# Patient Record
Sex: Female | Born: 1980 | Race: Black or African American | Hispanic: No | Marital: Single | Smoking: Former smoker
Health system: Southern US, Community
[De-identification: ages and names within clinical notes are randomized; demographics above are authoritative.]

## PROBLEM LIST (undated history)

## (undated) ENCOUNTER — Telehealth: Attending: Medical | Primary: Medical

## (undated) ENCOUNTER — Ambulatory Visit: Payer: PRIVATE HEALTH INSURANCE

## (undated) ENCOUNTER — Encounter
Attending: Rehabilitative and Restorative Service Providers" | Primary: Rehabilitative and Restorative Service Providers"

## (undated) ENCOUNTER — Ambulatory Visit

## (undated) ENCOUNTER — Telehealth
Attending: Student in an Organized Health Care Education/Training Program | Primary: Student in an Organized Health Care Education/Training Program

## (undated) ENCOUNTER — Ambulatory Visit: Payer: Medicaid (Managed Care)

## (undated) ENCOUNTER — Encounter

## (undated) ENCOUNTER — Encounter: Attending: Internal Medicine | Primary: Internal Medicine

## (undated) ENCOUNTER — Encounter: Attending: Adult Health | Primary: Adult Health

## (undated) ENCOUNTER — Ambulatory Visit: Payer: Medicaid (Managed Care) | Attending: Dermatology | Primary: Dermatology

## (undated) ENCOUNTER — Ambulatory Visit
Payer: Medicaid (Managed Care) | Attending: Student in an Organized Health Care Education/Training Program | Primary: Student in an Organized Health Care Education/Training Program

## (undated) ENCOUNTER — Ambulatory Visit: Payer: BLUE CROSS/BLUE SHIELD

## (undated) ENCOUNTER — Encounter: Attending: Neurology | Primary: Neurology

## (undated) ENCOUNTER — Telehealth

## (undated) ENCOUNTER — Ambulatory Visit
Payer: BLUE CROSS/BLUE SHIELD | Attending: Student in an Organized Health Care Education/Training Program | Primary: Student in an Organized Health Care Education/Training Program

## (undated) ENCOUNTER — Inpatient Hospital Stay: Payer: PRIVATE HEALTH INSURANCE

## (undated) ENCOUNTER — Telehealth: Attending: Family | Primary: Family

## (undated) ENCOUNTER — Encounter: Payer: MEDICAID | Attending: Registered" | Primary: Registered"

## (undated) ENCOUNTER — Encounter
Attending: Student in an Organized Health Care Education/Training Program | Primary: Student in an Organized Health Care Education/Training Program

## (undated) ENCOUNTER — Ambulatory Visit: Payer: MEDICAID

## (undated) ENCOUNTER — Ambulatory Visit: Payer: Medicaid (Managed Care) | Attending: Internal Medicine | Primary: Internal Medicine

## (undated) ENCOUNTER — Ambulatory Visit
Payer: MEDICAID | Attending: Student in an Organized Health Care Education/Training Program | Primary: Student in an Organized Health Care Education/Training Program

## (undated) ENCOUNTER — Telehealth: Attending: Nephrology | Primary: Nephrology

## (undated) ENCOUNTER — Telehealth: Attending: Ophthalmology | Primary: Ophthalmology

## (undated) ENCOUNTER — Ambulatory Visit: Payer: Medicaid (Managed Care) | Attending: Medical | Primary: Medical

## (undated) ENCOUNTER — Ambulatory Visit: Payer: PRIVATE HEALTH INSURANCE | Attending: Internal Medicine | Primary: Internal Medicine

## (undated) ENCOUNTER — Encounter: Payer: MEDICAID | Attending: Family | Primary: Family

## (undated) ENCOUNTER — Encounter: Payer: BLUE CROSS/BLUE SHIELD | Attending: Internal Medicine | Primary: Internal Medicine

## (undated) ENCOUNTER — Ambulatory Visit: Payer: BLUE CROSS/BLUE SHIELD | Attending: Internal Medicine | Primary: Internal Medicine

## (undated) ENCOUNTER — Telehealth: Attending: Physician Assistant | Primary: Physician Assistant

## (undated) ENCOUNTER — Ambulatory Visit: Payer: Medicaid (Managed Care) | Attending: Family Medicine | Primary: Family Medicine

## (undated) ENCOUNTER — Encounter: Attending: Medical | Primary: Medical

## (undated) ENCOUNTER — Non-Acute Institutional Stay: Payer: PRIVATE HEALTH INSURANCE

## (undated) ENCOUNTER — Inpatient Hospital Stay: Payer: Medicaid (Managed Care)

## (undated) ENCOUNTER — Ambulatory Visit: Payer: MEDICAID | Attending: Neurology | Primary: Neurology

## (undated) ENCOUNTER — Ambulatory Visit: Attending: Family | Primary: Family

## (undated) ENCOUNTER — Ambulatory Visit
Payer: PRIVATE HEALTH INSURANCE | Attending: Rehabilitative and Restorative Service Providers" | Primary: Rehabilitative and Restorative Service Providers"

## (undated) ENCOUNTER — Ambulatory Visit: Attending: Addiction (Substance Use Disorder) | Primary: Addiction (Substance Use Disorder)

## (undated) ENCOUNTER — Ambulatory Visit: Attending: Internal Medicine | Primary: Internal Medicine

## (undated) ENCOUNTER — Inpatient Hospital Stay

## (undated) ENCOUNTER — Encounter
Attending: Pharmacist Clinician (PhC)/ Clinical Pharmacy Specialist | Primary: Pharmacist Clinician (PhC)/ Clinical Pharmacy Specialist

## (undated) ENCOUNTER — Encounter: Payer: MEDICAID | Attending: Clinical | Primary: Clinical

## (undated) ENCOUNTER — Telehealth: Attending: Neurology | Primary: Neurology

## (undated) ENCOUNTER — Non-Acute Institutional Stay: Payer: MEDICAID

## (undated) ENCOUNTER — Ambulatory Visit: Payer: BLUE CROSS/BLUE SHIELD | Attending: Family Medicine | Primary: Family Medicine

## (undated) ENCOUNTER — Ambulatory Visit: Attending: Family Medicine | Primary: Family Medicine

## (undated) ENCOUNTER — Ambulatory Visit
Attending: Student in an Organized Health Care Education/Training Program | Primary: Student in an Organized Health Care Education/Training Program

## (undated) ENCOUNTER — Encounter: Payer: MEDICAID | Attending: Internal Medicine | Primary: Internal Medicine

## (undated) ENCOUNTER — Encounter: Attending: Adult Medicine | Primary: Adult Medicine

## (undated) ENCOUNTER — Encounter
Payer: PRIVATE HEALTH INSURANCE | Attending: Rehabilitative and Restorative Service Providers" | Primary: Rehabilitative and Restorative Service Providers"

## (undated) ENCOUNTER — Ambulatory Visit: Payer: Medicaid (Managed Care) | Attending: Neurology | Primary: Neurology

## (undated) ENCOUNTER — Encounter: Attending: Family Medicine | Primary: Family Medicine

## (undated) ENCOUNTER — Encounter: Attending: Clinical | Primary: Clinical

## (undated) ENCOUNTER — Ambulatory Visit: Payer: MEDICAID | Attending: Internal Medicine | Primary: Internal Medicine

## (undated) ENCOUNTER — Encounter: Attending: Ophthalmology | Primary: Ophthalmology

## (undated) ENCOUNTER — Encounter: Attending: Diagnostic Radiology | Primary: Diagnostic Radiology

## (undated) ENCOUNTER — Encounter: Attending: Physician Assistant | Primary: Physician Assistant

## (undated) DIAGNOSIS — H5440 Blindness, one eye, unspecified eye: Secondary | ICD-10-CM

## (undated) DIAGNOSIS — R4701 Aphasia: Secondary | ICD-10-CM

## (undated) DIAGNOSIS — L508 Other urticaria: Secondary | ICD-10-CM

## (undated) HISTORY — PX: OTHER SURGICAL HISTORY: SHX169

---
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
## 2008-04-23 ENCOUNTER — Emergency Department (HOSPITAL_COMMUNITY): Admission: EM | Admit: 2008-04-23 | Discharge: 2008-04-23 | Payer: Self-pay | Admitting: Emergency Medicine

## 2016-03-19 ENCOUNTER — Encounter (HOSPITAL_COMMUNITY): Payer: Self-pay

## 2016-03-19 ENCOUNTER — Emergency Department (HOSPITAL_COMMUNITY)
Admission: EM | Admit: 2016-03-19 | Discharge: 2016-03-19 | Disposition: A | Discharge status: Home / Self Care | Payer: BLUE CROSS/BLUE SHIELD | Attending: Emergency Medicine | Admitting: Emergency Medicine

## 2016-03-19 DIAGNOSIS — R21 Rash and other nonspecific skin eruption: Secondary | ICD-10-CM | POA: Diagnosis not present

## 2016-03-19 DIAGNOSIS — I10 Essential (primary) hypertension: Secondary | ICD-10-CM | POA: Insufficient documentation

## 2016-03-19 MED ORDER — PERMETHRIN 5 % EX CREA: 1.0000 "application " | TOPICAL_CREAM | Freq: Once | CUTANEOUS | Status: DC

## 2016-03-19 MED ORDER — HYDROCORTISONE 1 % EX CREA: TOPICAL_CREAM | CUTANEOUS | Status: DC

## 2016-03-19 NOTE — ED Notes (Addendum)
[facility] came back on Friday and started having a rash Sunday morning.  C/O itching.  Pt also requesting to have her liver enzymes checked because she says she has gained 20lb in 2 months.

## 2016-03-19 NOTE — Discharge Instructions (Signed)
Rash Use the cream as prescribed. Followup with your doctor about your blood pressure. Return to the ED if you develop new or worsening symptoms. A rash is a change in the color or texture of the skin. There are many different types of rashes. You may have other problems that accompany your rash. CAUSES   Infections.  Allergic reactions. This can include allergies to pets or foods.  Certain medicines.  Exposure to certain chemicals, soaps, or cosmetics.  Heat.  Exposure to poisonous plants.  Tumors, both cancerous and noncancerous. SYMPTOMS   Redness.  Scaly skin.  Itchy skin.  Dry or cracked skin.  Bumps.  Blisters.  Pain. DIAGNOSIS  Your caregiver may do a physical exam to determine what type of rash you have. A skin sample (biopsy) may be taken and examined under a microscope. TREATMENT  Treatment depends on the type of rash you have. Your caregiver may prescribe certain medicines. For serious conditions, you may need to see a skin doctor (dermatologist). HOME CARE INSTRUCTIONS   Avoid the substance that caused your rash.  Do not scratch your rash. This can cause infection.  You may take cool baths to help stop itching.  Only take over-the-counter or prescription medicines as directed by your caregiver.  Keep all follow-up appointments as directed by your caregiver. SEEK IMMEDIATE MEDICAL CARE IF:  You have increasing pain, swelling, or redness.  You have a fever.  You have new or severe symptoms.  You have body aches, diarrhea, or vomiting.  Your rash is not better after 3 days. MAKE SURE YOU:  Understand these instructions.  Will watch your condition.  Will get help right away if you are not doing well or get worse.   This information is not intended to replace advice given to you by your health care provider. Make sure you discuss any questions you have with your health care provider.   Document Released: 09/18/2002 Document Revised:  10/19/2014 Document Reviewed: 02/13/2015 Elsevier Interactive Patient Education [employer]2016 [employer]

## 2016-03-19 NOTE — ED Provider Notes (Signed)
CSN: [id number]     Arrival date & time 03/19/16  [id number] History   First MD Initiated Contact with Patient 03/19/16 [phone]     Chief Complaint  Patient presents with  . Rash     (Consider location/radiation/quality/duration/timing/severity/associated sxs/prior Treatment) HPI Comments: Patient presents with itchy red rash that started 5 days ago while she was staying at a hotel.  Boyfriend with similar rash.  Denies difficulty breathing or swallowing. No chest pain or SOB.  No fever.  Denies new soaps, lotions, detergents, medications, foods.  Has been using benadryl with partial relief.  Did not see any bugs.  Patient is a 35 y.o. female presenting with rash. The history is provided by the patient.  Rash Associated symptoms: no abdominal pain, no fever, no headaches, no joint pain, no myalgias, no nausea, no shortness of breath and not vomiting     History reviewed. No pertinent past medical history. Past Surgical History  Procedure Laterality Date  . Tubes in ears     No family history on file. Social History  Substance Use Topics  . Smoking status: Never Smoker   . Smokeless tobacco: None  . Alcohol Use: Yes     Comment: weekends   OB History    No data available     Review of Systems  Constitutional: Negative for fever, activity change and appetite change.  Respiratory: Negative for cough, chest tightness and shortness of breath.   Cardiovascular: Negative for chest pain.  Gastrointestinal: Negative for nausea, vomiting and abdominal pain.  Genitourinary: Negative for dysuria, hematuria, vaginal bleeding and vaginal discharge.  Musculoskeletal: Negative for myalgias and arthralgias.  Skin: Positive for rash.  Neurological: Negative for dizziness, light-headedness and headaches.  A complete 10 system review of systems was obtained and all systems are negative except as noted in the HPI and PMH.      Allergies  Sulfa antibiotics  Home Medications   Prior to Admission  medications   Medication Sig Start Date End Date Taking? Authorizing Provider  hydrocortisone cream 1 % Apply to affected area 2 times daily 03/19/16   Glynn OctaveStephen Dawon Troop, MD  permethrin (ELIMITE) 5 % cream Apply 1 application topically once. 03/19/16   Glynn OctaveStephen Almina Schul, MD   BP 147/105 mmHg  Pulse 110  Temp(Src) 97.6 F (36.4 C) (Oral)  Resp 18  Ht 5\' 6"  (1.676 m)  Wt 213 lb (96.616 kg)  BMI 34.40 kg/m2  SpO2 100%  LMP 03/12/2016 Physical Exam  Constitutional: She is oriented to person, place, and time. She appears well-developed and well-nourished. No distress.  HENT:  Head: Normocephalic and atraumatic.  Mouth/Throat: Oropharynx is clear and moist. No oropharyngeal exudate.  No tongue or lip swelling  Eyes: Conjunctivae and EOM are normal. Pupils are equal, round, and reactive to light.  Neck: Normal range of motion. Neck supple.  No meningismus.  Cardiovascular: Normal rate, regular rhythm, normal heart sounds and intact distal pulses.   No murmur heard. Pulmonary/Chest: Effort normal and breath sounds normal. No respiratory distress.  Abdominal: Soft. There is no tenderness. There is no rebound and no guarding.  Musculoskeletal: Normal range of motion. She exhibits no edema or tenderness.  Neurological: She is alert and oriented to person, place, and time. No cranial nerve deficit. She exhibits normal muscle tone. Coordination normal.  No ataxia on finger to nose bilaterally. No pronator drift. 5/5 strength throughout. CN 2-12 intact.Equal grip strength. Sensation intact.   Skin: Skin is warm. Rash noted.  Scattered erythematous papules  to trunk, legs, arms.  No burrows to hands or feet.  Psychiatric: She has a normal mood and affect. Her behavior is normal.  Nursing note and vitals reviewed.   ED Course  Procedures (including critical care time) Labs Review Labs Reviewed - No data to display  Imaging Review No results found. I have personally reviewed and evaluated these  images and lab results as part of my medical decision-making.   EKG Interpretation None      MDM   Final diagnoses:  Rash and nonspecific skin eruption  Essential hypertension   Itchy rash after staying in hotel.  Possibly bed bugs. Less likely scabies.  No systemic symptoms.  BP elevated.  D/w Patient need for PCP follow up.  No headache, chest pain, SOB, vision change.  We'll treat with permethrin for possible scabies versus bed bugs. Follow-up with PCP regarding elevated blood pressure. Return precautions discussed.  Glynn Octave, MD 03/19/16 [phone]

## 2017-09-07 ENCOUNTER — Emergency Department
Admission: EM | Admit: 2017-09-07 | Discharge: 2017-09-07 | Disposition: A | Discharge status: Home / Self Care | Payer: MEDICARE | Source: Intra-hospital

## 2018-04-12 ENCOUNTER — Encounter: Admit: 2018-04-12 | Discharge: 2018-04-12 | Disposition: A | Discharge status: Home / Self Care | Payer: MEDICAID

## 2018-04-12 DIAGNOSIS — R509 Fever, unspecified: Principal | ICD-10-CM

## 2018-10-31 ENCOUNTER — Encounter: Admit: 2018-10-31 | Discharge: 2018-11-01 | Payer: MEDICAID

## 2018-11-14 ENCOUNTER — Encounter: Admit: 2018-11-14 | Discharge: 2018-11-15 | Payer: MEDICAID

## 2018-11-23 ENCOUNTER — Encounter: Admit: 2018-11-23 | Discharge: 2018-11-24 | Payer: MEDICAID

## 2018-11-30 ENCOUNTER — Encounter: Admit: 2018-11-30 | Discharge: 2018-12-01 | Payer: MEDICAID

## 2018-12-05 ENCOUNTER — Encounter: Admit: 2018-12-05 | Discharge: 2018-12-06 | Discharge status: Against Medical Advice | Payer: MEDICAID

## 2019-03-20 ENCOUNTER — Encounter: Admit: 2019-03-20 | Discharge: 2019-03-21 | Payer: MEDICAID

## 2019-03-20 DIAGNOSIS — R0602 Shortness of breath: Secondary | ICD-10-CM

## 2019-03-20 DIAGNOSIS — L508 Other urticaria: Principal | ICD-10-CM

## 2019-03-20 MED ORDER — MONTELUKAST 10 MG TABLET
ORAL_TABLET | Freq: Every day | ORAL | 2 refills | 0.00000 days | Status: CP
Start: 2019-03-20 — End: 2020-03-19

## 2019-04-25 ENCOUNTER — Encounter: Admit: 2019-04-25 | Discharge: 2019-04-26 | Payer: MEDICAID | Attending: Adult Health | Primary: Adult Health

## 2019-04-25 DIAGNOSIS — R0602 Shortness of breath: Principal | ICD-10-CM

## 2019-04-25 DIAGNOSIS — R0600 Dyspnea, unspecified: Secondary | ICD-10-CM

## 2019-05-02 ENCOUNTER — Encounter: Admit: 2019-05-02 | Discharge: 2019-05-03 | Payer: MEDICAID | Attending: Adult Health | Primary: Adult Health

## 2019-05-02 ENCOUNTER — Encounter: Admit: 2019-05-02 | Discharge: 2019-05-03 | Payer: MEDICAID

## 2019-05-02 DIAGNOSIS — R0602 Shortness of breath: Principal | ICD-10-CM

## 2019-05-02 DIAGNOSIS — R0600 Dyspnea, unspecified: Secondary | ICD-10-CM

## 2019-05-02 DIAGNOSIS — Z720 Tobacco use: Secondary | ICD-10-CM

## 2019-05-02 MED ORDER — FUROSEMIDE 40 MG TABLET: 40 mg | tablet | Freq: Every day | 6 refills | 30 days | Status: AC

## 2019-05-02 MED ORDER — FUROSEMIDE 40 MG TABLET
ORAL_TABLET | Freq: Every day | ORAL | 6 refills | 30.00000 days | Status: CP
Start: 2019-05-02 — End: 2019-05-02

## 2019-05-29 ENCOUNTER — Encounter: Admit: 2019-05-29 | Discharge: 2019-05-30 | Payer: MEDICAID

## 2019-05-29 DIAGNOSIS — J3089 Other allergic rhinitis: Secondary | ICD-10-CM

## 2019-05-29 DIAGNOSIS — L508 Other urticaria: Principal | ICD-10-CM

## 2019-05-29 MED ORDER — EPINEPHRINE 0.3 MG/0.3 ML INJECTION, AUTO-INJECTOR: 0 mg | each | Freq: Once | 3 refills | 1 days | Status: AC

## 2019-05-29 MED ORDER — EPINEPHRINE 0.3 MG/0.3 ML INJECTION, AUTO-INJECTOR
Freq: Once | INTRAMUSCULAR | 3 refills | 1.00000 days | Status: CP
Start: 2019-05-29 — End: 2019-05-29

## 2019-07-12 DIAGNOSIS — L508 Other urticaria: Secondary | ICD-10-CM

## 2019-07-12 DIAGNOSIS — L501 Idiopathic urticaria: Secondary | ICD-10-CM

## 2020-01-16 DIAGNOSIS — L508 Other urticaria: Principal | ICD-10-CM

## 2020-01-17 MED ORDER — FUROSEMIDE 40 MG TABLET
ORAL_TABLET | Freq: Every day | ORAL | 0 refills | 30 days | Status: CP | PRN
Start: 2020-01-17 — End: ?

## 2020-01-17 MED ORDER — EPINEPHRINE 0.3 MG/0.3 ML INJECTION, AUTO-INJECTOR
Freq: Once | INTRAMUSCULAR | 3 refills | 1.00000 days | Status: CP
Start: 2020-01-17 — End: 2020-01-17

## 2020-01-17 MED ORDER — MONTELUKAST 10 MG TABLET
ORAL_TABLET | Freq: Every day | ORAL | 2 refills | 30.00000 days | Status: CP
Start: 2020-01-17 — End: 2021-01-16

## 2020-01-18 ENCOUNTER — Encounter: Admit: 2020-01-18 | Discharge: 2020-01-19 | Payer: MEDICAID

## 2020-01-18 DIAGNOSIS — J029 Acute pharyngitis, unspecified: Principal | ICD-10-CM

## 2020-01-18 DIAGNOSIS — R05 Cough: Principal | ICD-10-CM

## 2020-01-18 DIAGNOSIS — N421 Congestion and hemorrhage of prostate: Principal | ICD-10-CM

## 2020-01-19 ENCOUNTER — Encounter: Admit: 2020-01-19 | Discharge: 2020-01-20 | Payer: MEDICAID

## 2020-01-19 DIAGNOSIS — L508 Other urticaria: Principal | ICD-10-CM

## 2020-01-19 MED ORDER — EPINEPHRINE 0.3 MG/0.3 ML INJECTION, AUTO-INJECTOR
Freq: Once | INTRAMUSCULAR | 3 refills | 2.00000 days | Status: CP
Start: 2020-01-19 — End: 2020-01-19

## 2020-01-19 MED ORDER — BENZONATATE 100 MG CAPSULE
ORAL_CAPSULE | Freq: Three times a day (TID) | ORAL | 1 refills | 5 days | Status: CP | PRN
Start: 2020-01-19 — End: 2021-01-18

## 2020-01-19 MED ORDER — ALBUTEROL SULFATE HFA 90 MCG/ACTUATION AEROSOL INHALER
RESPIRATORY_TRACT | 0 refills | 0 days | Status: CP | PRN
Start: 2020-01-19 — End: 2021-01-18

## 2020-01-22 DIAGNOSIS — J3089 Other allergic rhinitis: Principal | ICD-10-CM

## 2020-02-06 ENCOUNTER — Ambulatory Visit: Admit: 2020-02-06 | Discharge: 2020-02-07 | Payer: MEDICAID

## 2020-02-06 DIAGNOSIS — J3089 Other allergic rhinitis: Principal | ICD-10-CM

## 2020-02-06 DIAGNOSIS — I10 Essential (primary) hypertension: Principal | ICD-10-CM

## 2020-02-06 DIAGNOSIS — G4730 Sleep apnea, unspecified: Principal | ICD-10-CM

## 2020-02-06 DIAGNOSIS — Z113 Encounter for screening for infections with a predominantly sexual mode of transmission: Principal | ICD-10-CM

## 2020-02-06 MED ORDER — CHLORTHALIDONE 25 MG TABLET
ORAL_TABLET | Freq: Every morning | ORAL | 11 refills | 30 days | Status: CP
Start: 2020-02-06 — End: 2021-02-05

## 2020-02-07 DIAGNOSIS — B9689 Other specified bacterial agents as the cause of diseases classified elsewhere: Principal | ICD-10-CM

## 2020-02-07 DIAGNOSIS — N760 Acute vaginitis: Principal | ICD-10-CM

## 2020-02-07 MED ORDER — METRONIDAZOLE 500 MG TABLET
ORAL_TABLET | Freq: Two times a day (BID) | ORAL | 0 refills | 7.00000 days | Status: CP
Start: 2020-02-07 — End: 2020-02-14

## 2020-02-09 DIAGNOSIS — L501 Idiopathic urticaria: Principal | ICD-10-CM

## 2020-02-12 ENCOUNTER — Institutional Professional Consult (permissible substitution): Admit: 2020-02-12 | Discharge: 2020-02-13

## 2020-02-12 DIAGNOSIS — L501 Idiopathic urticaria: Principal | ICD-10-CM

## 2020-03-08 ENCOUNTER — Ambulatory Visit: Admit: 2020-03-08 | Discharge: 2020-03-09

## 2020-03-12 ENCOUNTER — Ambulatory Visit: Admit: 2020-03-12 | Payer: MEDICAID

## 2020-03-12 ENCOUNTER — Encounter: Admit: 2020-03-12 | Payer: MEDICAID

## 2020-03-19 ENCOUNTER — Encounter: Admit: 2020-03-19 | Discharge: 2020-03-20 | Payer: MEDICAID

## 2020-03-22 ENCOUNTER — Ambulatory Visit: Admit: 2020-03-22 | Payer: MEDICAID

## 2020-03-25 ENCOUNTER — Telehealth
Admit: 2020-03-25 | Discharge: 2020-03-26 | Payer: MEDICAID | Attending: Student in an Organized Health Care Education/Training Program | Primary: Student in an Organized Health Care Education/Training Program

## 2020-04-10 DIAGNOSIS — L501 Idiopathic urticaria: Principal | ICD-10-CM

## 2020-04-10 MED ORDER — CETIRIZINE 10 MG TABLET
ORAL_TABLET | Freq: Two times a day (BID) | ORAL | 11 refills | 30.00000 days | Status: CP
Start: 2020-04-10 — End: 2021-04-10

## 2020-04-16 ENCOUNTER — Ambulatory Visit: Admit: 2020-04-16 | Discharge: 2020-04-17

## 2020-04-16 DIAGNOSIS — L501 Idiopathic urticaria: Principal | ICD-10-CM

## 2020-05-07 ENCOUNTER — Encounter: Admit: 2020-05-07 | Discharge: 2020-05-07 | Payer: MEDICAID

## 2020-05-07 ENCOUNTER — Ambulatory Visit
Admit: 2020-05-07 | Discharge: 2020-05-07 | Payer: MEDICAID | Attending: Physician Assistant | Primary: Physician Assistant

## 2020-05-07 DIAGNOSIS — Z6841 Body Mass Index (BMI) 40.0 and over, adult: Principal | ICD-10-CM

## 2020-05-07 DIAGNOSIS — L501 Idiopathic urticaria: Principal | ICD-10-CM

## 2020-06-11 ENCOUNTER — Ambulatory Visit: Admit: 2020-06-11 | Payer: MEDICAID

## 2020-06-11 ENCOUNTER — Ambulatory Visit: Admit: 2020-06-11 | Payer: MEDICAID | Attending: Physician Assistant | Primary: Physician Assistant

## 2020-06-18 ENCOUNTER — Ambulatory Visit
Admit: 2020-06-18 | Discharge: 2020-06-19 | Payer: MEDICAID | Attending: Student in an Organized Health Care Education/Training Program | Primary: Student in an Organized Health Care Education/Training Program

## 2020-06-18 DIAGNOSIS — R0989 Other specified symptoms and signs involving the circulatory and respiratory systems: Principal | ICD-10-CM

## 2020-06-18 DIAGNOSIS — H6980 Other specified disorders of Eustachian tube, unspecified ear: Principal | ICD-10-CM

## 2020-06-18 DIAGNOSIS — J309 Allergic rhinitis, unspecified: Principal | ICD-10-CM

## 2020-06-18 MED ORDER — OMEPRAZOLE 20 MG CAPSULE,DELAYED RELEASE
ORAL_CAPSULE | Freq: Every day | ORAL | 3 refills | 90 days | Status: CP
Start: 2020-06-18 — End: 2021-06-18

## 2020-06-18 MED ORDER — FLUTICASONE PROPIONATE 50 MCG/ACTUATION NASAL SPRAY,SUSPENSION
Freq: Two times a day (BID) | NASAL | 2 refills | 0 days | Status: CP
Start: 2020-06-18 — End: 2021-06-18

## 2020-08-06 ENCOUNTER — Encounter: Admit: 2020-08-06 | Discharge: 2020-08-07 | Payer: MEDICAID

## 2020-08-06 DIAGNOSIS — L501 Idiopathic urticaria: Principal | ICD-10-CM

## 2020-08-20 ENCOUNTER — Ambulatory Visit: Admit: 2020-08-20 | Discharge: 2020-08-21 | Payer: MEDICAID | Attending: Internal Medicine | Primary: Internal Medicine

## 2020-08-20 DIAGNOSIS — I10 Essential (primary) hypertension: Principal | ICD-10-CM

## 2020-08-20 DIAGNOSIS — E669 Obesity, unspecified: Principal | ICD-10-CM

## 2020-08-20 DIAGNOSIS — R0602 Shortness of breath: Principal | ICD-10-CM

## 2020-08-20 DIAGNOSIS — R609 Edema, unspecified: Principal | ICD-10-CM

## 2020-08-20 MED ORDER — FUROSEMIDE 40 MG TABLET
ORAL_TABLET | 3 refills | 0 days | Status: CP
Start: 2020-08-20 — End: ?

## 2020-08-21 DIAGNOSIS — I10 Essential (primary) hypertension: Principal | ICD-10-CM

## 2020-08-21 MED ORDER — POTASSIUM CHLORIDE ER 10 MEQ TABLET,EXTENDED RELEASE
ORAL_TABLET | Freq: Every day | ORAL | 11 refills | 30 days | Status: CP
Start: 2020-08-21 — End: 2021-08-21

## 2020-09-09 DIAGNOSIS — G4730 Sleep apnea, unspecified: Principal | ICD-10-CM

## 2020-12-20 DIAGNOSIS — I639 Cerebral infarction, unspecified: Secondary | ICD-10-CM

## 2020-12-20 HISTORY — DX: Cerebral infarction, unspecified: I63.9

## 2020-12-21 ENCOUNTER — Ambulatory Visit
Admit: 2020-12-21 | Discharge: 2020-12-23 | Disposition: A | Discharge status: Home / Self Care | Payer: MEDICAID | Admitting: Neurology

## 2020-12-23 MED ORDER — ATORVASTATIN 80 MG TABLET
ORAL_TABLET | Freq: Every evening | ORAL | 3 refills | 30.00000 days | Status: CP
Start: 2020-12-23 — End: 2021-01-22

## 2020-12-24 MED ORDER — ASPIRIN 81 MG CHEWABLE TABLET
ORAL_TABLET | Freq: Every day | ORAL | 4 refills | 30.00000 days | Status: CP
Start: 2020-12-24 — End: 2021-01-23

## 2020-12-31 ENCOUNTER — Encounter: Admit: 2020-12-31 | Discharge: 2020-12-31 | Disposition: A | Discharge status: Home / Self Care | Payer: MEDICAID

## 2020-12-31 ENCOUNTER — Emergency Department: Admit: 2020-12-31 | Discharge: 2020-12-31 | Disposition: A | Discharge status: Home / Self Care | Payer: MEDICAID

## 2020-12-31 DIAGNOSIS — R519 Periorbital headache: Principal | ICD-10-CM

## 2020-12-31 MED ORDER — PREDNISONE 20 MG TABLET
ORAL_TABLET | Freq: Every day | ORAL | 0 refills | 3 days | Status: CP
Start: 2020-12-31 — End: 2021-01-03

## 2021-01-09 ENCOUNTER — Ambulatory Visit: Admit: 2021-01-09 | Discharge: 2021-01-10 | Payer: MEDICAID

## 2021-01-09 DIAGNOSIS — H3412 Central retinal artery occlusion, left eye: Principal | ICD-10-CM

## 2021-01-09 DIAGNOSIS — I6522 Occlusion and stenosis of left carotid artery: Principal | ICD-10-CM

## 2021-01-09 DIAGNOSIS — I639 Cerebral infarction, unspecified: Principal | ICD-10-CM

## 2021-01-16 ENCOUNTER — Encounter: Admit: 2021-01-16 | Discharge: 2021-01-17 | Payer: MEDICAID

## 2021-01-16 DIAGNOSIS — R748 Abnormal levels of other serum enzymes: Principal | ICD-10-CM

## 2021-01-16 DIAGNOSIS — I639 Cerebral infarction, unspecified: Principal | ICD-10-CM

## 2021-01-21 ENCOUNTER — Ambulatory Visit: Admit: 2021-01-21 | Discharge: 2021-01-22 | Payer: MEDICAID

## 2021-01-21 DIAGNOSIS — Z30430 Encounter for insertion of intrauterine contraceptive device: Principal | ICD-10-CM

## 2021-01-21 DIAGNOSIS — N852 Hypertrophy of uterus: Principal | ICD-10-CM

## 2021-01-31 DIAGNOSIS — Z1239 Encounter for other screening for malignant neoplasm of breast: Principal | ICD-10-CM

## 2021-02-03 ENCOUNTER — Encounter: Admit: 2021-02-03 | Discharge: 2021-02-04 | Payer: MEDICAID

## 2021-02-14 ENCOUNTER — Encounter: Admit: 2021-02-14 | Discharge: 2021-02-15 | Payer: MEDICAID

## 2021-02-14 DIAGNOSIS — L501 Idiopathic urticaria: Principal | ICD-10-CM

## 2021-03-11 ENCOUNTER — Ambulatory Visit
Admit: 2021-03-11 | Payer: BLUE CROSS/BLUE SHIELD | Attending: Student in an Organized Health Care Education/Training Program | Primary: Student in an Organized Health Care Education/Training Program

## 2021-03-13 ENCOUNTER — Encounter: Admit: 2021-03-13 | Discharge: 2021-04-11 | Payer: BLUE CROSS/BLUE SHIELD

## 2021-03-13 ENCOUNTER — Encounter
Admit: 2021-03-13 | Discharge: 2021-03-14 | Payer: BLUE CROSS/BLUE SHIELD | Attending: Student in an Organized Health Care Education/Training Program | Primary: Student in an Organized Health Care Education/Training Program

## 2021-03-13 ENCOUNTER — Encounter: Admit: 2021-03-13 | Discharge: 2021-03-14 | Payer: BLUE CROSS/BLUE SHIELD

## 2021-03-13 ENCOUNTER — Encounter: Admit: 2021-03-13 | Discharge: 2021-04-11 | Payer: PRIVATE HEALTH INSURANCE

## 2021-03-13 ENCOUNTER — Ambulatory Visit: Admit: 2021-03-13 | Discharge: 2021-04-11 | Payer: BLUE CROSS/BLUE SHIELD

## 2021-03-13 DIAGNOSIS — L501 Idiopathic urticaria: Principal | ICD-10-CM

## 2021-03-17 ENCOUNTER — Ambulatory Visit
Admit: 2021-03-17 | Discharge: 2021-03-17 | Disposition: A | Discharge status: Home / Self Care | Payer: BLUE CROSS/BLUE SHIELD

## 2021-03-17 ENCOUNTER — Encounter
Admit: 2021-03-17 | Discharge: 2021-03-17 | Disposition: A | Discharge status: Home / Self Care | Payer: BLUE CROSS/BLUE SHIELD

## 2021-03-17 DIAGNOSIS — I6522 Occlusion and stenosis of left carotid artery: Principal | ICD-10-CM

## 2021-03-17 DIAGNOSIS — J029 Acute pharyngitis, unspecified: Principal | ICD-10-CM

## 2021-04-09 ENCOUNTER — Encounter: Admit: 2021-04-09 | Discharge: 2021-04-10 | Payer: BLUE CROSS/BLUE SHIELD

## 2021-04-09 DIAGNOSIS — N898 Other specified noninflammatory disorders of vagina: Principal | ICD-10-CM

## 2021-04-09 DIAGNOSIS — I639 Cerebral infarction, unspecified: Principal | ICD-10-CM

## 2021-04-09 DIAGNOSIS — R6884 Jaw pain: Principal | ICD-10-CM

## 2021-04-09 MED ORDER — ATORVASTATIN 20 MG TABLET
ORAL_TABLET | Freq: Every evening | ORAL | 3 refills | 90 days | Status: CP
Start: 2021-04-09 — End: 2021-07-08

## 2021-04-10 DIAGNOSIS — A599 Trichomoniasis, unspecified: Principal | ICD-10-CM

## 2021-04-10 MED ORDER — METRONIDAZOLE 500 MG TABLET
ORAL_TABLET | Freq: Once | ORAL | 0 refills | 1.00000 days | Status: CP
Start: 2021-04-10 — End: 2021-04-10

## 2021-04-11 ENCOUNTER — Institutional Professional Consult (permissible substitution): Admit: 2021-04-11 | Discharge: 2021-04-11

## 2021-04-11 ENCOUNTER — Ambulatory Visit: Admit: 2021-04-11 | Discharge: 2021-04-11

## 2021-04-11 DIAGNOSIS — Z9189 Other specified personal risk factors, not elsewhere classified: Principal | ICD-10-CM

## 2021-04-11 DIAGNOSIS — R6884 Jaw pain: Principal | ICD-10-CM

## 2021-04-11 DIAGNOSIS — J3081 Allergic rhinitis due to animal (cat) (dog) hair and dander: Principal | ICD-10-CM

## 2021-04-11 DIAGNOSIS — J3089 Other allergic rhinitis: Principal | ICD-10-CM

## 2021-04-11 DIAGNOSIS — J301 Allergic rhinitis due to pollen: Principal | ICD-10-CM

## 2021-04-11 DIAGNOSIS — L501 Idiopathic urticaria: Principal | ICD-10-CM

## 2021-04-16 ENCOUNTER — Encounter: Admit: 2021-04-16 | Discharge: 2021-04-17 | Payer: BLUE CROSS/BLUE SHIELD

## 2021-04-16 DIAGNOSIS — F0631 Mood disorder due to known physiological condition with depressive features: Principal | ICD-10-CM

## 2021-04-16 DIAGNOSIS — D509 Iron deficiency anemia, unspecified: Principal | ICD-10-CM

## 2021-04-16 DIAGNOSIS — I639 Cerebral infarction, unspecified: Principal | ICD-10-CM

## 2021-04-16 MED ORDER — AMITRIPTYLINE 25 MG TABLET
ORAL_TABLET | Freq: Every evening | ORAL | 3 refills | 180.00000 days | Status: CP
Start: 2021-04-16 — End: 2022-04-16

## 2021-04-20 DIAGNOSIS — R6884 Jaw pain: Principal | ICD-10-CM

## 2021-04-20 DIAGNOSIS — A599 Trichomoniasis, unspecified: Principal | ICD-10-CM

## 2021-04-20 MED ORDER — METRONIDAZOLE 500 MG TABLET
ORAL_TABLET | Freq: Once | ORAL | 0 refills | 1 days | Status: CP
Start: 2021-04-20 — End: 2021-04-20

## 2021-04-24 ENCOUNTER — Ambulatory Visit: Admit: 2021-04-24 | Discharge: 2021-04-25

## 2021-04-24 ENCOUNTER — Ambulatory Visit: Admit: 2021-04-24 | Discharge: 2021-04-25 | Attending: Internal Medicine | Primary: Internal Medicine

## 2021-04-24 DIAGNOSIS — Z8673 Personal history of transient ischemic attack (TIA), and cerebral infarction without residual deficits: Principal | ICD-10-CM

## 2021-04-24 DIAGNOSIS — L501 Idiopathic urticaria: Principal | ICD-10-CM

## 2021-04-24 DIAGNOSIS — I10 Essential (primary) hypertension: Principal | ICD-10-CM

## 2021-04-24 DIAGNOSIS — I6522 Occlusion and stenosis of left carotid artery: Principal | ICD-10-CM

## 2021-04-24 MED ORDER — XOLAIR 150 MG/ML SUBCUTANEOUS SYRINGE
SUBCUTANEOUS | 11 refills | 28 days | Status: CP
Start: 2021-04-24 — End: ?

## 2021-04-24 MED ORDER — ROSUVASTATIN 20 MG TABLET
ORAL_TABLET | Freq: Every day | ORAL | 6 refills | 30 days | Status: CP
Start: 2021-04-24 — End: 2021-05-24

## 2021-06-02 ENCOUNTER — Emergency Department (HOSPITAL_COMMUNITY): Payer: Self-pay

## 2021-06-02 ENCOUNTER — Encounter (HOSPITAL_COMMUNITY): Payer: Self-pay | Admitting: *Deleted

## 2021-06-02 ENCOUNTER — Other Ambulatory Visit: Payer: Self-pay

## 2021-06-02 ENCOUNTER — Emergency Department (HOSPITAL_COMMUNITY)
Admission: EM | Admit: 2021-06-02 | Discharge: 2021-06-02 | Disposition: A | Discharge status: Home / Self Care | Payer: Self-pay | Attending: Student | Admitting: Student

## 2021-06-02 DIAGNOSIS — L501 Idiopathic urticaria: Principal | ICD-10-CM

## 2021-06-02 DIAGNOSIS — Z20822 Contact with and (suspected) exposure to covid-19: Secondary | ICD-10-CM | POA: Insufficient documentation

## 2021-06-02 DIAGNOSIS — M5412 Radiculopathy, cervical region: Secondary | ICD-10-CM | POA: Insufficient documentation

## 2021-06-02 DIAGNOSIS — M5033 Other cervical disc degeneration, cervicothoracic region: Secondary | ICD-10-CM | POA: Insufficient documentation

## 2021-06-02 DIAGNOSIS — R1013 Epigastric pain: Secondary | ICD-10-CM | POA: Insufficient documentation

## 2021-06-02 DIAGNOSIS — Z87891 Personal history of nicotine dependence: Secondary | ICD-10-CM | POA: Insufficient documentation

## 2021-06-02 DIAGNOSIS — M5030 Other cervical disc degeneration, unspecified cervical region: Secondary | ICD-10-CM

## 2021-06-02 DIAGNOSIS — Y900 Blood alcohol level of less than 20 mg/100 ml: Secondary | ICD-10-CM | POA: Insufficient documentation

## 2021-06-02 HISTORY — DX: Aphasia: R47.01

## 2021-06-02 HISTORY — DX: Blindness, one eye, unspecified eye: H54.40

## 2021-06-02 HISTORY — DX: Other urticaria: L50.8

## 2021-06-02 LAB — HEPATIC FUNCTION PANEL
ALT: 15 U/L (ref 0–44)
AST: 18 U/L (ref 15–41)
Albumin: 3.3 g/dL — ABNORMAL LOW (ref 3.5–5.0)
Alkaline Phosphatase: 87 U/L (ref 38–126)
Bilirubin, Direct: <0.1 mg/dL (ref 0.0–0.2)
Total Bilirubin: 0.5 mg/dL (ref 0.3–1.2)
Total Protein: 7.6 g/dL (ref 6.5–8.1)

## 2021-06-02 LAB — BASIC METABOLIC PANEL
Anion gap: 6 (ref 5–15)
BUN: 5 mg/dL — ABNORMAL LOW (ref 6–20)
CO2: 26 mmol/L (ref 22–32)
Calcium: 8.9 mg/dL (ref 8.9–10.3)
Chloride: 104 mmol/L (ref 98–111)
Creatinine, Ser: 0.85 mg/dL (ref 0.44–1.00)
GFR, Estimated: >60 mL/min (ref >60)
Glucose, Bld: 112 mg/dL — ABNORMAL HIGH (ref 70–99)
Potassium: 3.6 mmol/L (ref 3.5–5.1)
Sodium: 136 mmol/L (ref 135–145)

## 2021-06-02 LAB — CBC
HCT: 34.5 % — ABNORMAL LOW (ref 36.0–46.0)
Hemoglobin: 9.9 g/dL — ABNORMAL LOW (ref 12.0–15.0)
MCH: 23 pg — ABNORMAL LOW (ref 26.0–34.0)
MCHC: 28.7 g/dL — ABNORMAL LOW (ref 30.0–36.0)
MCV: 80 fL (ref 80.0–100.0)
Platelets: 329 10*3/uL (ref 150–400)
RBC: 4.31 MIL/uL (ref 3.87–5.11)
RDW: 20.4 % — ABNORMAL HIGH (ref 11.5–15.5)
WBC: 8.4 10*3/uL (ref 4.0–10.5)
nRBC: 0 % (ref 0.0–0.2)

## 2021-06-02 LAB — TROPONIN I (HIGH SENSITIVITY)
Troponin I (High Sensitivity): <2 ng/L (ref <18)
Troponin I (High Sensitivity): <2 ng/L (ref <18)

## 2021-06-02 LAB — RESP PANEL BY RT-PCR (FLU A&B, COVID) ARPGX2
Influenza A by PCR: NEGATIVE
Influenza B by PCR: NEGATIVE
SARS Coronavirus 2 by RT PCR: NEGATIVE

## 2021-06-02 LAB — RAPID URINE DRUG SCREEN, HOSP PERFORMED
Amphetamines: NOT DETECTED
Barbiturates: NOT DETECTED
Benzodiazepines: NOT DETECTED
Cocaine: POSITIVE — AB
Opiates: NOT DETECTED
Tetrahydrocannabinol: POSITIVE — AB

## 2021-06-02 LAB — HCG, QUANTITATIVE, PREGNANCY: hCG, Beta Chain, Quant, S: <1 m[IU]/mL (ref <5)

## 2021-06-02 LAB — PROTIME-INR
INR: 1.1 (ref 0.8–1.2)
Prothrombin Time: 14 seconds (ref 11.4–15.2)

## 2021-06-02 LAB — APTT: aPTT: 31 seconds (ref 24–36)

## 2021-06-02 LAB — ETHANOL: Alcohol, Ethyl (B): <10 mg/dL (ref <10)

## 2021-06-02 LAB — LIPASE, BLOOD: Lipase: 47 U/L (ref 11–51)

## 2021-06-02 MED ORDER — IOHEXOL 350 MG/ML SOLN
75.0000 mL | Freq: Once | INTRAVENOUS | Status: AC | PRN
  Administered 2021-06-02: 75 mL via INTRAVENOUS

## 2021-06-02 MED ORDER — LORAZEPAM 2 MG/ML IJ SOLN
0.5000 mg | Freq: Once | INTRAMUSCULAR | Status: AC
  Administered 2021-06-02: 0.5 mg via INTRAVENOUS
  Filled 2021-06-02: qty 1

## 2021-06-02 MED ORDER — IBUPROFEN 600 MG PO TABS: 600.0000 mg | ORAL_TABLET | Freq: Three times a day (TID) | ORAL | 0 refills | Status: AC

## 2021-06-02 MED ORDER — GADOBUTROL 1 MMOL/ML IV SOLN
10.0000 mL | Freq: Once | INTRAVENOUS | Status: AC | PRN
  Administered 2021-06-02: 10 mL via INTRAVENOUS

## 2021-06-02 MED ORDER — ACETAMINOPHEN 500 MG PO TABS
1000.0000 mg | ORAL_TABLET | Freq: Once | ORAL | Status: AC
  Administered 2021-06-02: 1000 mg via ORAL
  Filled 2021-06-02: qty 2

## 2021-06-02 NOTE — Discharge Instructions (Addendum)
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  You will require evaluation by a neurosurgeon for further evaluation and treatment of this condition.  Call Dr. Johnsie Cancel who is our local neurosurgeon.  Alternately if you would rather contact your primary provider to arrange a [facility] neurosurgeon that would be your choice as well.  In the interim get rechecked immediately for any weakness or loss of control of your arm as this can be signs of a worsening cervical neck condition.  Plan follow-up with your neurologist as discussed.  Your thyroid is enlarged on today's imaging studies and it is recommended that you have an ultrasound of your thyroid.  This can be ordered by your primary doctor.

## 2021-06-02 NOTE — ED Provider Notes (Signed)
[facility] EMERGENCY DEPARTMENT Provider Note   CSN: [id number] Arrival date & time: 06/02/21  4098     History Chief Complaint  Patient presents with   Numbness    Left arm since Saturday   Chest Pain    Sandra Barber is a 40 y.o. 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  She reports waking the past 2 nights with episodes of numbness radiating to her fingertips along with pain in her forearm with wrist flexion.  She denies any injuries to this extremity.  Additionally this morning she developed upper abdominal pain described as a hunger sensation only a lot worse along with nausea but no emesis.  She denies chest pain, shortness of breath, fevers, chills, cough.  She denies any weakness in her legs, also denies headache, dizziness palpitations.  She has found no alleviators for her symptoms and has taken no medications prior to arrival.  The history is provided by the patient.      Past Medical History:  Diagnosis Date   Aphasia    Blind left eye    Chronic urticaria    Stroke (HCC) 12/20/2020    There are no problems to display for this patient.   Past Surgical History:  Procedure Laterality Date   tubes in ears       OB History   No obstetric history on file.     No family history on file.  Social History   Tobacco Use   Smoking status: Former    Types: Cigarettes   Smokeless tobacco: Never  Vaping Use   Vaping Use: Never used  Substance Use Topics   Alcohol use: Yes    Comment: occasionally   Drug use: No    Home Medications Prior to Admission medications   Medication Sig Start Date End Date Taking? Authorizing Provider  ibuprofen (ADVIL) 600 MG tablet Take 1 tablet (600 mg total) by mouth 3 (three) times daily. 06/02/21  Yes  Gypsy Kellogg, Raynelle Fanning, PA-C  hydrocortisone cream 1 % Apply to affected area 2 times daily Patient not taking: Reported on 06/02/2021 03/19/16   Rancour, Jeannett Senior, MD  permethrin (ELIMITE) 5 % cream Apply 1 application topically once. Patient not taking: Reported on 06/02/2021 03/19/16   Glynn Octave, MD    Allergies    Sulfa antibiotics  Review of Systems   Review of Systems  Constitutional:  Negative for chills and fever.  HENT:  Negative for congestion and sore throat.   Eyes: Negative.   Respiratory:  Negative for chest tightness and shortness of breath.   Cardiovascular:  Negative for chest pain.  Gastrointestinal:  Negative for abdominal pain and nausea.  Genitourinary: Negative.   Musculoskeletal:  Negative for arthralgias, joint swelling and neck pain.  Skin: Negative.  Negative for rash and wound.  Neurological:  Positive for numbness. Negative for dizziness, weakness, light-headedness and headaches.  Psychiatric/Behavioral: Negative.     Physical Exam Updated Vital Signs BP (!) 113/94   Pulse (!) 106   Temp 99 F (37.2 C) (Oral)   Resp 11   Ht  (1.676 m)   Wt 86.2 kg   LMP 05/25/2021   SpO2 100%   BMI 30.67 kg/m   Physical Exam Vitals and nursing note reviewed.  Constitutional:  Appearance: She is well-developed.  HENT:     Head: Normocephalic and atraumatic.  Eyes:     Conjunctiva/sclera: Conjunctivae normal.  Cardiovascular:     Rate and Rhythm: Normal rate and regular rhythm.     Heart sounds: Normal heart sounds.  Pulmonary:     Effort: Pulmonary effort is normal.     Breath sounds: Normal breath sounds. No wheezing.  Abdominal:     General: Bowel sounds are normal.     Palpations: Abdomen is soft.     Tenderness: There is abdominal tenderness in the epigastric area.  Musculoskeletal:        General: Normal range of motion.     Cervical back: Normal range of motion.  Skin:    General: Skin is warm and dry.  Neurological:     Mental Status: She  is alert.    ED Results / Procedures / Treatments   Labs (all labs ordered are listed, but only abnormal results are displayed) Labs Reviewed  BASIC METABOLIC PANEL - Abnormal; Notable for the following components:      Result Value   Glucose, Bld 112 (*)    BUN 5 (*)    All other components within normal limits  CBC - Abnormal; Notable for the following components:   Hemoglobin 9.9 (*)    HCT 34.5 (*)    MCH 23.0 (*)    MCHC 28.7 (*)    RDW 20.4 (*)    All other components within normal limits  HEPATIC FUNCTION PANEL - Abnormal; Notable for the following components:   Albumin 3.3 (*)    All other components within normal limits  RAPID URINE DRUG SCREEN, HOSP PERFORMED - Abnormal; Notable for the following components:   Cocaine POSITIVE (*)    Tetrahydrocannabinol POSITIVE (*)    All other components within normal limits  RESP PANEL BY RT-PCR (FLU A&B, COVID) ARPGX2  HCG, QUANTITATIVE, PREGNANCY  LIPASE, BLOOD  ETHANOL  PROTIME-INR  APTT  TROPONIN I (HIGH SENSITIVITY)  TROPONIN I (HIGH SENSITIVITY)    EKG None  Radiology CT Angio Head W or Wo Contrast  Result Date: 06/02/2021 CLINICAL DATA:  Left arm numbness and tingling for 2 days EXAM: CT ANGIOGRAPHY HEAD AND NECK TECHNIQUE: Multidetector CT imaging of the head and neck was performed using the standard protocol during bolus administration of intravenous contrast. Multiplanar CT image reconstructions and MIPs were obtained to evaluate the vascular anatomy. Carotid stenosis measurements (when applicable) are obtained utilizing NASCET criteria, using the distal internal carotid diameter as the denominator. CONTRAST:  52mL OMNIPAQUE IOHEXOL 350 MG/ML SOLN COMPARISON:  None. FINDINGS: CT HEAD FINDINGS Brain: There is a remote infarct in the left centrum semiovale/frontal lobe cortex. There is additional hypodensity in the left parietal lobe cortex suspicious for evolving subacute infarct (5-24). There is no evidence of acute  intracranial hemorrhage or extra-axial fluid collection. The ventricles are normal in size. There is no midline shift. No mass lesion is identified. Vascular: See below Skull: Normal. Negative for fracture or focal lesion. Sinuses and orbits: The imaged paranasal sinuses are clear. The mastoid air cells are clear. The globes and orbits are unremarkable. Review of the MIP images confirms the above findings CTA NECK FINDINGS Aortic arch: The aortic arch is unremarkable. The right brachiocephalic and subclavian arteries are patent. There are patchy foci of hypodensity within the left subclavian artery (11-287, 13-263). Right carotid system: No evidence of dissection, stenosis (50% or greater) or occlusion. Left carotid system: The left  common carotid artery is occluded throughout its course. The left internal carotid artery is occluded from the bifurcation through the ophthalmic segment. The external carotid artery branches are patent shortly after the origin. Vertebral arteries: Codominant. No evidence of dissection, stenosis (50% or greater) or occlusion. Skeleton: There is mild degenerative change of the cervical spine. There is mild anterior wedge deformity of the T5 vertebral body which appears congenital. Other neck: The thyroid is diffusely enlarged and heterogeneous with individual nodules measuring up to 2.1 cm. Upper chest: The lung apices are clear. Review of the MIP images confirms the above findings CTA HEAD FINDINGS Anterior circulation: The left intracranial internal carotid artery is occluded until the ophthalmic segment where there is reconstitution of flow likely via the anterior and posterior communicating arteries. The left ACA and MCA are patent. Posterior circulation: The V4 segments of the vertebral arteries are patent. The basilar artery is patent. The bilateral PCAs are patent. Venous sinuses: The left transverse and sigmoid sinuses appear hypoplastic, likely a normal variant. The left internal  jugular vein appears patent. Anatomic variants: Posterior communicating arteries are identified bilaterally. Review of the MIP images confirms the above findings IMPRESSION: 1. Occlusion of the left common and internal carotid arteries from the origin through the ophthalmic segment with reconstitution of flow likely via the anterior and posterior communicating arteries. This may be a chronic finding as the patient had a reported internal carotid artery occlusion in March; however, these images are not available for direct comparison. The remainder of the intracranial and extracranial vasculature is patent. 2. Probable early subacuate infarct in the left parietal lobe. MRI may be obtained for confirmation. 3. Apparent filling defects in the left subclavian artery may be artifactual. If the patient proceeds to catheter angiogram, this may be assessed at that time. Otherwise, MRA may be helpful to reassess. 4. Enlarged and heterogeneous thyroid for which nonemergent thyroid ultrasound is recommended if not already obtained elsewhere. These results were called by telephone at the time of interpretation on 06/02/2021 at 1:57 pm to provider Lenni Reckner , who verbally acknowledged these results. Electronically Signed   By: Lesia Hausen M.D.   On: 06/02/2021 14:01   DG Chest 2 View  Result Date: 06/02/2021 CLINICAL DATA:  Chest pain. EXAM: CHEST - 2 VIEW COMPARISON:  None. FINDINGS: The heart size and mediastinal contours are within normal limits. Both lungs are clear. The visualized skeletal structures are unremarkable. IMPRESSION: No active cardiopulmonary disease. Electronically Signed   By: Obie Dredge M.D.   On: 06/02/2021 10:57   CT Angio Neck W and/or Wo Contrast  Result Date: 06/02/2021 CLINICAL DATA:  Left arm numbness and tingling for 2 days EXAM: CT ANGIOGRAPHY HEAD AND NECK TECHNIQUE: Multidetector CT imaging of the head and neck was performed using the standard protocol during bolus administration of  intravenous contrast. Multiplanar CT image reconstructions and MIPs were obtained to evaluate the vascular anatomy. Carotid stenosis measurements (when applicable) are obtained utilizing NASCET criteria, using the distal internal carotid diameter as the denominator. CONTRAST:  75mL OMNIPAQUE IOHEXOL 350 MG/ML SOLN COMPARISON:  None. FINDINGS: CT HEAD FINDINGS Brain: There is a remote infarct in the left centrum semiovale/frontal lobe cortex. There is additional hypodensity in the left parietal lobe cortex suspicious for evolving subacute infarct (5-24). There is no evidence of acute intracranial hemorrhage or extra-axial fluid collection. The ventricles are normal in size. There is no midline shift. No mass lesion is identified. Vascular: See below Skull: Normal. Negative for  fracture or focal lesion. Sinuses and orbits: The imaged paranasal sinuses are clear. The mastoid air cells are clear. The globes and orbits are unremarkable. Review of the MIP images confirms the above findings CTA NECK FINDINGS Aortic arch: The aortic arch is unremarkable. The right brachiocephalic and subclavian arteries are patent. There are patchy foci of hypodensity within the left subclavian artery (11-287, 13-263). Right carotid system: No evidence of dissection, stenosis (50% or greater) or occlusion. Left carotid system: The left common carotid artery is occluded throughout its course. The left internal carotid artery is occluded from the bifurcation through the ophthalmic segment. The external carotid artery branches are patent shortly after the origin. Vertebral arteries: Codominant. No evidence of dissection, stenosis (50% or greater) or occlusion. Skeleton: There is mild degenerative change of the cervical spine. There is mild anterior wedge deformity of the T5 vertebral body which appears congenital. Other neck: The thyroid is diffusely enlarged and heterogeneous with individual nodules measuring up to 2.1 cm. Upper chest: The  lung apices are clear. Review of the MIP images confirms the above findings CTA HEAD FINDINGS Anterior circulation: The left intracranial internal carotid artery is occluded until the ophthalmic segment where there is reconstitution of flow likely via the anterior and posterior communicating arteries. The left ACA and MCA are patent. Posterior circulation: The V4 segments of the vertebral arteries are patent. The basilar artery is patent. The bilateral PCAs are patent. Venous sinuses: The left transverse and sigmoid sinuses appear hypoplastic, likely a normal variant. The left internal jugular vein appears patent. Anatomic variants: Posterior communicating arteries are identified bilaterally. Review of the MIP images confirms the above findings IMPRESSION: 1. Occlusion of the left common and internal carotid arteries from the origin through the ophthalmic segment with reconstitution of flow likely via the anterior and posterior communicating arteries. This may be a chronic finding as the patient had a reported internal carotid artery occlusion in March; however, these images are not available for direct comparison. The remainder of the intracranial and extracranial vasculature is patent. 2. Probable early subacuate infarct in the left parietal lobe. MRI may be obtained for confirmation. 3. Apparent filling defects in the left subclavian artery may be artifactual. If the patient proceeds to catheter angiogram, this may be assessed at that time. Otherwise, MRA may be helpful to reassess. 4. Enlarged and heterogeneous thyroid for which nonemergent thyroid ultrasound is recommended if not already obtained elsewhere. These results were called by telephone at the time of interpretation on 06/02/2021 at 1:57 pm to provider Savon Bordonaro , who verbally acknowledged these results. Electronically Signed   By: Lesia Hausen M.D.   On: 06/02/2021 14:01   MR BRAIN W WO CONTRAST  Result Date: 06/02/2021 CLINICAL DATA:  Neuro  deficit, acute, stroke suspected EXAM: MRI HEAD WITHOUT AND WITH CONTRAST MRI CERVICAL SPINE WITHOUT AND WITH CONTRAST TECHNIQUE: Multiplanar, multiecho pulse sequences of the brain and surrounding structures were obtained without and with intravenous contrast. Multiplanar and multiecho pulse sequences of the cervical spine, to include the craniocervical junction and cervicothoracic junction, were obtained without and with intravenous contrast. CONTRAST:  38mL GADAVIST GADOBUTROL 1 MMOL/ML IV SOLN COMPARISON:  None. Same day CT FINDINGS: MRI HEAD: Motion limited postcontrast imaging. Brain: No acute infarction, hemorrhage, hydrocephalus, extra-axial collection or mass lesion. Remote cortical/subcortical infarct in the left frontal lobe with multiple additional smaller remote infarcts in the left frontal and parietal lobes. No abnormal enhancement on the motion limited postcontrast imaging. Vascular: Absent left ICA  flow void, compatible with occlusion that was better chacterized on same day CTA. Skull and upper cervical spine: Diffuse mild T1 hypointensity of the marrow without focal marrow replacing lesion. Sinuses/Orbits: Retention cyst in the inferior left maxillary sinus, partially imaged. Unremarkable orbits. Other: No mastoid effusions. MRI CERVICAL SPINE: Motion limited study, particularly the the GRE and postcontrast imaging. Alignment: Slight reversal of the normal cervical lordosis. Vertebrae: Mild diffuse T1 hypointensity of the bone marrow. No focal suspicious lesion. No specific evidence of acute fracture or discitis/osteomyelitis. Cord: Normal cord signal. No abnormal cord enhancement on motion limited postcontrast imaging. Vertebral arteries, paraspinal tissues: Visualized vertebral artery flow voids are maintained. No paraspinal edema. Disc levels: C2-C3: No significant disc protrusion, foraminal stenosis, or canal stenosis. C3-C4: Small central disc protrusion which contacts and flattens the ventral  cord without significant canal stenosis. No significant foraminal stenosis. C4-C5: Left eccentric posterior disc osteophyte complex, which contacts and flattens the ventral cord. Left greater than right uncovertebral hypertrophy. Mild left foraminal stenosis. C5-C6: Posterior disc osteophyte complex contacts and flattens the left ventral cord without significant canal stenosis. Left greater than right facet and uncovertebral hypertrophy with moderate left foraminal stenosis. No significant right foraminal stenosis. C6-C7: Left eccentric posterior disc osteophyte complex and left greater than right uncovertebral hypertrophy. Mild left foraminal stenosis without significant canal or right foraminal stenosis. C7-T1: No significant disc protrusion, foraminal stenosis, or canal stenosis. IMPRESSION: MRI HEAD: 1. No evidence of acute intracranial abnormality. Specifically, no acute infarct. 2. Remote cortical/subcortical infarct in the left frontal lobe with multiple additional smaller remote infarcts in the left frontal and parietal lobes. 3. Absent left ICA flow void, compatible with occlusion that was better chacterized on same day CTA. MRI CERVICAL SPINE: 1. Moderate left foraminal stenosis at C5-C6. Mild left foraminal stenosis at C4-C5 and C6-C7. 2. Posterior disc osteophyte complexes contact and flatten the ventral cord at C4-C5 and C5-C6 (and to a lesser extent C3-C4) without significant canal stenosis. 3. Mild diffuse T1 hypointensity of the marrow, which is nonspecific but can be seen with chronic anemia, chronic hypoxia (such as in smokers), obesity, and less commonly lymphoproliferative disorders. 4. Enlarged and heterogeneous thyroid. Recommend non-urgent thyroid ultrasound. Electronically Signed   By: Feliberto HartsFrederick S Jones M.D.   On: 06/02/2021 18:02   MR CERVICAL SPINE W WO CONTRAST  Result Date: 06/02/2021 CLINICAL DATA:  Neuro deficit, acute, stroke suspected EXAM: MRI HEAD WITHOUT AND WITH CONTRAST MRI  CERVICAL SPINE WITHOUT AND WITH CONTRAST TECHNIQUE: Multiplanar, multiecho pulse sequences of the brain and surrounding structures were obtained without and with intravenous contrast. Multiplanar and multiecho pulse sequences of the cervical spine, to include the craniocervical junction and cervicothoracic junction, were obtained without and with intravenous contrast. CONTRAST:  10mL GADAVIST GADOBUTROL 1 MMOL/ML IV SOLN COMPARISON:  None. Same day CT FINDINGS: MRI HEAD: Motion limited postcontrast imaging. Brain: No acute infarction, hemorrhage, hydrocephalus, extra-axial collection or mass lesion. Remote cortical/subcortical infarct in the left frontal lobe with multiple additional smaller remote infarcts in the left frontal and parietal lobes. No abnormal enhancement on the motion limited postcontrast imaging. Vascular: Absent left ICA flow void, compatible with occlusion that was better chacterized on same day CTA. Skull and upper cervical spine: Diffuse mild T1 hypointensity of the marrow without focal marrow replacing lesion. Sinuses/Orbits: Retention cyst in the inferior left maxillary sinus, partially imaged. Unremarkable orbits. Other: No mastoid effusions. MRI CERVICAL SPINE: Motion limited study, particularly the the GRE and postcontrast imaging. Alignment: Slight reversal of the  normal cervical lordosis. Vertebrae: Mild diffuse T1 hypointensity of the bone marrow. No focal suspicious lesion. No specific evidence of acute fracture or discitis/osteomyelitis. Cord: Normal cord signal. No abnormal cord enhancement on motion limited postcontrast imaging. Vertebral arteries, paraspinal tissues: Visualized vertebral artery flow voids are maintained. No paraspinal edema. Disc levels: C2-C3: No significant disc protrusion, foraminal stenosis, or canal stenosis. C3-C4: Small central disc protrusion which contacts and flattens the ventral cord without significant canal stenosis. No significant foraminal stenosis.  C4-C5: Left eccentric posterior disc osteophyte complex, which contacts and flattens the ventral cord. Left greater than right uncovertebral hypertrophy. Mild left foraminal stenosis. C5-C6: Posterior disc osteophyte complex contacts and flattens the left ventral cord without significant canal stenosis. Left greater than right facet and uncovertebral hypertrophy with moderate left foraminal stenosis. No significant right foraminal stenosis. C6-C7: Left eccentric posterior disc osteophyte complex and left greater than right uncovertebral hypertrophy. Mild left foraminal stenosis without significant canal or right foraminal stenosis. C7-T1: No significant disc protrusion, foraminal stenosis, or canal stenosis. IMPRESSION: MRI HEAD: 1. No evidence of acute intracranial abnormality. Specifically, no acute infarct. 2. Remote cortical/subcortical infarct in the left frontal lobe with multiple additional smaller remote infarcts in the left frontal and parietal lobes. 3. Absent left ICA flow void, compatible with occlusion that was better chacterized on same day CTA. MRI CERVICAL SPINE: 1. Moderate left foraminal stenosis at C5-C6. Mild left foraminal stenosis at C4-C5 and C6-C7. 2. Posterior disc osteophyte complexes contact and flatten the ventral cord at C4-C5 and C5-C6 (and to a lesser extent C3-C4) without significant canal stenosis. 3. Mild diffuse T1 hypointensity of the marrow, which is nonspecific but can be seen with chronic anemia, chronic hypoxia (such as in smokers), obesity, and less commonly lymphoproliferative disorders. 4. Enlarged and heterogeneous thyroid. Recommend non-urgent thyroid ultrasound. Electronically Signed   By: Feliberto Harts M.D.   On: 06/02/2021 18:02    Procedures Procedures   Medications Ordered in ED Medications  iohexol (OMNIPAQUE) 350 MG/ML injection 75 mL (75 mLs Intravenous Contrast Given 06/02/21 1252)  acetaminophen (TYLENOL) tablet 1,000 mg (1,000 mg Oral Given 06/02/21  1415)  LORazepam (ATIVAN) injection 0.5 mg (0.5 mg Intravenous Given 06/02/21 1543)  gadobutrol (GADAVIST) 1 MMOL/ML injection 10 mL (10 mLs Intravenous Contrast Given 06/02/21 1634)    ED Course  I have reviewed the triage vital signs and the nursing notes.  Pertinent labs & imaging results that were available during my care of the patient were reviewed by me and considered in my medical decision making (see chart for details).    MDM Rules/Calculators/A&P                           After initial CT scanning was resulted and concern for a new left parietal stroke, contacted [facility] to discuss with patient's primary neurologist.  Spoke with Dr. Laural Benes who agreed that CT imaging is not consistent with patient's symptoms.  She did recommend an MRI and if indeed she does have a new right sided lesion which would correlate with her symptoms that she would complete a echocardiogram to rule out PFO, this being negative she can be discharged home but would recommend a 21-day course of Plavix if she does have a new right sided brain lesion.  While awaiting further imaging, we also underwent a teleneurology consult, please refer to the consult note in patient's chart.  He also added that if her brain MRI is negative for  acute findings he would also add an MRI of her C-spine looking for a cervical source of her radicular numbness.  Additionally she does have a component of pain in her forearm and wrist, she may need a nerve conduction study looking for carpal tunnel syndrome if the MRI of her brain and C-spine do not give a diagnosis.  After review of her imaging including the MRI of brain and C-spine, she does NOT have new CVA, but does indeed have degenerative disc disease at the C4-C 6 levels which explains her numbness.  At reexam she still does not have any weakness, she has full grip strength.  She is stable for discharge home, although she will benefit from neurosurgical consult as an outpatient for  further management of this cervical radiculopathy.  She was given a referral to Dr. Johnsie Cancel, however all of her other care is at [facility], she may choose to contact her PCP for referral to a [facility] neurosurgeon.  She was given strict return precautions or immediate follow-up for any new weakness or new loss of function in her arm.  She is placed on anti-inflammatory.  The patient appears reasonably screened and/or stabilized for discharge and I doubt any other medical condition or other [facility] requiring further screening, evaluation, or treatment in the ED at this time prior to discharge.  Final Clinical Impression(s) / ED Diagnoses Final diagnoses:  Cervical radiculopathy  DDD (degenerative disc disease), cervical    Rx / DC Orders ED Discharge Orders          Ordered    ibuprofen (ADVIL) 600 MG tablet  3 times daily        06/02/21 1913             Victoriano Lain 06/02/21 1949    Kommor, Wyn For[place], MD 06/03/21 1359

## 2021-06-02 NOTE — Consult Note (Signed)
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  Presents with numbness in left arm since Saturday,    Etiology for Symptoms/presentation is unclear, new ischemic stroke is a possibility (but please note possible abnormality on the head CT is on the left side which will not cause left arm numbness), less likely would be demyelination. C spine etiology is also possible.  Last time known well>24 hours therefore not a candidate for Thrombolysis or Endovascular treatment.  Would be admitted for stroke workup. If brain MRI (should be with and without contrast) is negative then C spine MRI with and without contrast and if negative outpatient NCS/EMG  Continue Aspirin for now.  Further recommendations below.    CT HEAD: Reviewed  Our recommendations are outlined below.  Laboratory Studies: Recommend Lipid panel Hemoglobin A1c  Medication: Initiate Aspirin 81 mg daily Statins for LDL goal less than 70  Nursing Recommendations: Telemetry, IV Fluids, avoid dextrose containing fluids, Maintain euglycemia Neuro checks q4 hrs x 24 hrs and then per shift Head of bed 30 degrees  Consultations: Recommend Speech therapy if failed dysphagia screen Physical therapy/Occupational therapy  DVT Prophylaxis: Choice of Primary Team  Disposition: Neurology will follow  Additional Recommendations: MRA neck with contrast (to recheck abnormal findings on CTA neck).     brain MRI with and without contrast (to check for demyelination as well) if positive for stroke then TTE , hypercoagulable workup and add plavix.  if negative then C spine MRI with and without contrast (given  that she has some left arm pain as well) and if negative outpatient NCS/EMG      Imaging 1. 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  This may be a chronic finding as the patient had a reported internal  carotid artery occlusion in March; however, these images are not  available for direct comparison. The remainder of the intracranial  and extracranial vasculature is patent.  2. Probable early subacuate infarct in the left parietal lobe. MRI  may be obtained for confirmation.  3. Apparent filling defects in the left subclavian artery may be  artifactual. If the patient proceeds to catheter angiogram, this may  be assessed at that time. Otherwise, MRA may be helpful to reassess.  Metrics: [facility] Notification Time: 06/02/2021 14:47:25 Stamp Time: 06/02/2021 14:49:03 Callback Response Time: 06/02/2021 14:49:51   ----------------------------------------------------------------------------------------------------  Chief Complaint: left arm numbness  History of Present Illness: Patient is a 40 year old Female.  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 Presents with numbness in left arm since Saturday,   Anticoagulation or Antiplatelet use: Aspirin Family history of Stroke: yes, sister Smoking: yes    Past Medical History:      Stroke  Anticoagulant use:  No  Antiplatelet use: Yes   Examination: BP(120//84), Pulse(92), Blood Glucose(112) 1A: Level of Consciousness - Alert; keenly responsive + 0 1B: Ask Month and Age - Both Questions Right + 0 1C: Blink Eyes & Squeeze Hands - Performs Both Tasks + 0 2: Test Horizontal  Extraocular Movements - Normal + 0 3: Test Visual Fields - No Visual Loss  + 0 4: Test Facial Palsy (Use Grimace if Obtunded) - Normal symmetry + 0 5A: Test Left Arm Motor Drift - No Drift for 10 Seconds + 0 5B: Test Right Arm Motor Drift - No Drift for 10 Seconds + 0 6A: Test Left Leg Motor Drift - No Drift for 5 Seconds + 0 6B: Test Right Leg Motor Drift - No Drift for 5 Seconds + 0 7: Test Limb Ataxia (FNF/Heel-Shin) - No Ataxia + 0 8: Test Sensation - Mild-Moderate Loss: Less Sharp/More Dull + 1 9: Test Language/Aphasia - Normal; No aphasia + 0 10: Test Dysarthria - Normal + 0 11: Test Extinction/Inattention - No abnormality + 0  NIHSS Score: 1     Patient / Family was informed the Neurology Consult would occur via TeleHealth consult by way of interactive audio and video telecommunications and consented to receiving care in this manner.  Patient is being evaluated for possible acute neurologic impairment and high probability of imminent or life - threatening deterioration.I spent total of 25 minutes providing care to this patient, including time for face to face visit via telemedicine, review of medical records, imaging studies and discussion of findings with providers, the patient and / or family.   Dr Driscilla Grammes   [facility] [phone]  Case [id number]

## 2021-06-02 NOTE — ED Provider Notes (Signed)
Emergency Medicine Provider Triage Evaluation Note  Sandra Barber , a 40 y.o. female  was evaluated in triage.  Pt complains of 3 day history of right arm numbness which has been intermittent has woke her up at night, describing pain and radiation of her arm from hand and wrist, also has complaints of nausea without emesis and upper abdominal pain which has been waxing waning since yesterday.  Has significant history of stroke in March secondary to a trauma induced left ICA thrombus.  Patient states this is still present and has been monitored.  Review of Systems  Positive: Abdominal pain, nausea, left arm numbness Negative: Chest pain, shortness of breath, headache, extremity weakness  Physical Exam  BP (!) 129/94 (BP Location: Right Arm)   Pulse 97   Temp 99 F (37.2 C) (Oral)   Resp 18   Ht 5\' 6"  (1.676 m)   Wt 86.2 kg   LMP 05/25/2021   SpO2 100%   BMI 30.67 kg/m  Gen:   Awake, no distress   Resp:  Normal effort  MSK:   Moves extremities without difficulty  Other:  Decreased sensation right forearm.  Equal grip strength.  Negative tinels. Medical Decision Making  Medically screening exam initiated at 10:30 AM.  Appropriate orders placed.  Sandra Barber was informed that the remainder of the evaluation will be completed by another provider, this initial triage assessment does not replace that evaluation, and the importance of remaining in the ED until their evaluation is complete.  Patient with recent CVA secondary to trauma inducing occlusion of the left ICA presenting with right arm numbness x 3 days.  Initial screening labs and imaging ordered.    Tora Perches, PA-C 06/02/21 1050    Kommor, [place], MD 06/02/21 [phone]

## 2021-06-02 NOTE — ED Notes (Signed)
Patient transported to MRI 

## 2021-06-02 NOTE — ED Triage Notes (Addendum)
Pt c/o left arm tingling, numbness, pain since Saturday. Pt reports lower chest pain/epigastric pain that started this morning. Pt reports the tingling and numbness in her left arm has been intermittent since Saturday, but the pain has been constant.

## 2021-06-03 MED ORDER — XOLAIR 150 MG/ML SUBCUTANEOUS SYRINGE
SUBCUTANEOUS | 11 refills | 28.00000 days | Status: CP
Start: 2021-06-03 — End: ?

## 2021-06-25 ENCOUNTER — Institutional Professional Consult (permissible substitution): Admit: 2021-06-25 | Discharge: 2021-06-25

## 2021-06-25 ENCOUNTER — Ambulatory Visit: Admit: 2021-06-25 | Discharge: 2021-06-25

## 2021-06-25 DIAGNOSIS — I10 Essential (primary) hypertension: Principal | ICD-10-CM

## 2021-06-25 DIAGNOSIS — D509 Iron deficiency anemia, unspecified: Principal | ICD-10-CM

## 2021-06-25 DIAGNOSIS — L501 Idiopathic urticaria: Principal | ICD-10-CM

## 2021-06-25 DIAGNOSIS — M5412 Radiculopathy, cervical region: Principal | ICD-10-CM

## 2021-06-25 DIAGNOSIS — L508 Other urticaria: Principal | ICD-10-CM

## 2021-06-25 DIAGNOSIS — I6522 Occlusion and stenosis of left carotid artery: Principal | ICD-10-CM

## 2021-06-25 DIAGNOSIS — Z8673 Personal history of transient ischemic attack (TIA), and cerebral infarction without residual deficits: Principal | ICD-10-CM

## 2021-06-25 MED ORDER — EPINEPHRINE 0.3 MG/0.3 ML INJECTION, AUTO-INJECTOR
Freq: Once | INTRAMUSCULAR | 3 refills | 2.00000 days | Status: CP
Start: 2021-06-25 — End: 2021-06-25

## 2021-07-03 DIAGNOSIS — F329 Major depressive disorder, single episode, unspecified: Principal | ICD-10-CM

## 2021-07-09 ENCOUNTER — Ambulatory Visit: Admit: 2021-07-09 | Discharge: 2021-07-10 | Attending: Neurology | Primary: Neurology

## 2021-07-09 ENCOUNTER — Ambulatory Visit: Admit: 2021-07-09 | Discharge: 2021-07-10

## 2021-07-09 DIAGNOSIS — M5412 Radiculopathy, cervical region: Principal | ICD-10-CM

## 2021-07-09 DIAGNOSIS — I639 Cerebral infarction, unspecified: Principal | ICD-10-CM

## 2021-07-09 MED ORDER — AMITRIPTYLINE 25 MG TABLET
ORAL_TABLET | Freq: Every evening | ORAL | 6 refills | 90.00000 days | Status: CP
Start: 2021-07-09 — End: 2022-07-09

## 2021-07-09 MED ORDER — SIMVASTATIN 40 MG TABLET
ORAL_TABLET | Freq: Every evening | ORAL | 6 refills | 30.00000 days | Status: CP
Start: 2021-07-09 — End: 2022-07-09

## 2021-07-09 MED ORDER — METHYLPREDNISOLONE 4 MG TABLETS IN A DOSE PACK
0 refills | 0 days | Status: CP
Start: 2021-07-09 — End: ?

## 2021-07-11 ENCOUNTER — Ambulatory Visit: Admit: 2021-07-11 | Discharge: 2021-07-12 | Payer: MEDICAID

## 2021-07-11 DIAGNOSIS — M542 Cervicalgia: Principal | ICD-10-CM

## 2021-07-22 ENCOUNTER — Ambulatory Visit: Admit: 2021-07-22 | Discharge: 2021-07-22

## 2021-07-22 ENCOUNTER — Institutional Professional Consult (permissible substitution): Admit: 2021-07-22 | Discharge: 2021-07-22

## 2021-07-22 DIAGNOSIS — Z8673 Personal history of transient ischemic attack (TIA), and cerebral infarction without residual deficits: Principal | ICD-10-CM

## 2021-07-22 DIAGNOSIS — R519 Nonintractable headache, unspecified chronicity pattern, unspecified headache type: Principal | ICD-10-CM

## 2021-07-22 DIAGNOSIS — D508 Other iron deficiency anemias: Principal | ICD-10-CM

## 2021-07-22 DIAGNOSIS — L501 Idiopathic urticaria: Principal | ICD-10-CM

## 2021-07-22 DIAGNOSIS — F0631 Mood disorder due to known physiological condition with depressive features: Principal | ICD-10-CM

## 2021-07-22 DIAGNOSIS — E785 Hyperlipidemia, unspecified: Principal | ICD-10-CM

## 2021-07-22 DIAGNOSIS — I639 Cerebral infarction, unspecified: Principal | ICD-10-CM

## 2021-07-22 DIAGNOSIS — M5412 Radiculopathy, cervical region: Principal | ICD-10-CM

## 2021-07-22 MED ORDER — ASCORBIC ACID (VITAMIN C) 250 MG CHEWABLE TABLET
ORAL_TABLET | Freq: Every day | ORAL | 5 refills | 0 days | Status: CP
Start: 2021-07-22 — End: ?

## 2021-07-22 MED ORDER — FERROUS SULFATE 325 MG (65 MG IRON) TABLET
ORAL_TABLET | Freq: Every day | ORAL | 11 refills | 30 days | Status: CP
Start: 2021-07-22 — End: 2022-07-22

## 2021-08-05 DIAGNOSIS — D508 Other iron deficiency anemias: Principal | ICD-10-CM

## 2021-08-17 DIAGNOSIS — D508 Other iron deficiency anemias: Principal | ICD-10-CM

## 2021-08-22 ENCOUNTER — Institutional Professional Consult (permissible substitution): Admit: 2021-08-22 | Discharge: 2021-08-23

## 2021-08-22 DIAGNOSIS — L501 Idiopathic urticaria: Principal | ICD-10-CM

## 2021-08-22 MED ORDER — XOLAIR 150 MG/ML SUBCUTANEOUS SYRINGE
SUBCUTANEOUS | 11 refills | 28.00000 days | Status: CP
Start: 2021-08-22 — End: ?

## 2021-08-25 DIAGNOSIS — L501 Idiopathic urticaria: Principal | ICD-10-CM

## 2021-08-28 ENCOUNTER — Ambulatory Visit: Admit: 2021-08-28 | Discharge: 2021-08-29

## 2021-08-28 DIAGNOSIS — R5383 Other fatigue: Principal | ICD-10-CM

## 2021-08-28 DIAGNOSIS — Z0000 Encounter for general adult medical examination without abnormal findings: Principal | ICD-10-CM

## 2021-08-29 MED ORDER — METRONIDAZOLE 500 MG TABLET
ORAL_TABLET | Freq: Two times a day (BID) | ORAL | 0 refills | 7.00000 days | Status: CP
Start: 2021-08-29 — End: 2021-09-05

## 2021-09-02 DIAGNOSIS — I639 Cerebral infarction, unspecified: Principal | ICD-10-CM

## 2021-09-02 DIAGNOSIS — R768 Other specified abnormal immunological findings in serum: Principal | ICD-10-CM

## 2021-09-10 DIAGNOSIS — I639 Cerebral infarction, unspecified: Principal | ICD-10-CM

## 2021-09-10 MED ORDER — SIMVASTATIN 40 MG TABLET
ORAL_TABLET | Freq: Every evening | ORAL | 11 refills | 30 days | Status: CP
Start: 2021-09-10 — End: ?

## 2021-09-10 MED ORDER — FUROSEMIDE 40 MG TABLET
ORAL_TABLET | Freq: Every day | ORAL | 5 refills | 30 days | Status: CP | PRN
Start: 2021-09-10 — End: ?

## 2021-09-11 MED ORDER — AMITRIPTYLINE 25 MG TABLET
ORAL_TABLET | Freq: Every evening | ORAL | 6 refills | 90 days | Status: CP
Start: 2021-09-11 — End: 2022-09-11
  Filled 2021-09-16: qty 30, 30d supply, fill #0

## 2021-09-11 MED ORDER — FLUTICASONE PROPIONATE 50 MCG/ACTUATION NASAL SPRAY,SUSPENSION
Freq: Two times a day (BID) | NASAL | 2 refills | 31 days | Status: CP
Start: 2021-09-11 — End: 2022-09-11
  Filled 2021-09-16: qty 16, 15d supply, fill #0

## 2021-09-11 MED ORDER — FUROSEMIDE 40 MG TABLET
ORAL_TABLET | Freq: Every day | ORAL | 5 refills | 30 days | Status: CP | PRN
Start: 2021-09-11 — End: ?
  Filled 2021-09-16: qty 30, 30d supply, fill #0

## 2021-09-11 MED ORDER — ASCORBIC ACID (VITAMIN C) 250 MG CHEWABLE TABLET
ORAL_TABLET | Freq: Every day | ORAL | 5 refills | 0 days | Status: CP
Start: 2021-09-11 — End: ?
  Filled 2021-09-15: qty 100, 100d supply, fill #0

## 2021-09-11 MED ORDER — FERROUS SULFATE 325 MG (65 MG IRON) TABLET
ORAL_TABLET | Freq: Every day | ORAL | 11 refills | 30 days | Status: CP
Start: 2021-09-11 — End: 2022-09-11
  Filled 2021-09-16: qty 30, 30d supply, fill #0

## 2021-09-11 MED ORDER — EPINEPHRINE 0.3 MG/0.3 ML INJECTION, AUTO-INJECTOR
Freq: Once | INTRAMUSCULAR | 12 refills | 2 days | Status: CP
Start: 2021-09-11 — End: 2021-09-13
  Filled 2021-09-16: qty 2, 2d supply, fill #0

## 2021-09-11 MED ORDER — SIMVASTATIN 40 MG TABLET
ORAL_TABLET | Freq: Every evening | ORAL | 11 refills | 30 days | Status: CP
Start: 2021-09-11 — End: ?
  Filled 2021-09-16: qty 30, 30d supply, fill #0

## 2021-09-11 MED ORDER — IBUPROFEN 600 MG TABLET
ORAL_TABLET | Freq: Four times a day (QID) | ORAL | 2 refills | 15 days | Status: CP | PRN
Start: 2021-09-11 — End: 2021-10-11
  Filled 2021-09-16: qty 60, 15d supply, fill #0

## 2021-09-12 MED ORDER — CICLOPIROX 8 % TOPICAL SOLUTION
Freq: Every evening | TOPICAL | 0 refills | 0.00000 days | Status: CP
Start: 2021-09-12 — End: ?

## 2021-09-30 MED ORDER — CLOTRIMAZOLE-BETAMETHASONE 1 %-0.05 % TOPICAL CREAM
1 refills | 0 days | Status: CP
Start: 2021-09-30 — End: 2022-09-30

## 2021-09-30 MED ORDER — LIRAGLUTIDE 0.6 MG/0.1 ML (18 MG/3 ML) SUBCUTANEOUS PEN INJECTOR
Freq: Every day | SUBCUTANEOUS | 1 refills | 180 days | Status: CP
Start: 2021-09-30 — End: 2022-09-30

## 2021-10-01 ENCOUNTER — Ambulatory Visit: Admit: 2021-10-01 | Discharge: 2021-10-01

## 2021-10-01 DIAGNOSIS — R928 Other abnormal and inconclusive findings on diagnostic imaging of breast: Principal | ICD-10-CM

## 2021-10-02 MED ORDER — CETIRIZINE 10 MG TABLET
ORAL_TABLET | 11 refills | 0.00000 days | Status: CP
Start: 2021-10-02 — End: ?
  Filled 2021-10-08: qty 240, 30d supply, fill #0

## 2021-10-08 ENCOUNTER — Ambulatory Visit: Admit: 2021-10-08 | Discharge: 2021-10-09 | Payer: MEDICAID

## 2021-10-08 MED FILL — IBUPROFEN 600 MG TABLET: ORAL | 15 days supply | Qty: 60 | Fill #1

## 2021-10-10 ENCOUNTER — Ambulatory Visit: Admit: 2021-10-10 | Discharge: 2021-10-11

## 2021-10-10 DIAGNOSIS — L501 Idiopathic urticaria: Principal | ICD-10-CM

## 2021-10-10 MED ORDER — HYDROXYCHLOROQUINE 200 MG TABLET
ORAL_TABLET | Freq: Two times a day (BID) | ORAL | 3 refills | 90.00000 days | Status: CP
Start: 2021-10-10 — End: 2022-10-10
  Filled 2021-10-10: qty 60, 30d supply, fill #0

## 2021-10-10 MED ORDER — HYDROXYZINE HCL 10 MG TABLET
ORAL_TABLET | Freq: Three times a day (TID) | ORAL | 2 refills | 10.00000 days | Status: CP | PRN
Start: 2021-10-10 — End: 2022-10-10
  Filled 2021-11-10: qty 30, 10d supply, fill #0

## 2021-10-15 ENCOUNTER — Ambulatory Visit: Admit: 2021-10-15 | Discharge: 2021-10-16

## 2021-10-15 DIAGNOSIS — R7989 Other specified abnormal findings of blood chemistry: Principal | ICD-10-CM

## 2021-10-15 DIAGNOSIS — D508 Other iron deficiency anemias: Principal | ICD-10-CM

## 2021-10-16 ENCOUNTER — Ambulatory Visit
Admit: 2021-10-16 | Discharge: 2021-10-17 | Payer: MEDICAID | Attending: Student in an Organized Health Care Education/Training Program | Primary: Student in an Organized Health Care Education/Training Program

## 2021-10-29 MED ORDER — METRONIDAZOLE 500 MG TABLET
ORAL_TABLET | Freq: Two times a day (BID) | ORAL | 0 refills | 7 days | Status: CP
Start: 2021-10-29 — End: 2021-11-05
  Filled 2021-11-10: qty 14, 7d supply, fill #0

## 2021-11-10 MED FILL — CETIRIZINE 10 MG TABLET: ORAL | 30 days supply | Qty: 240 | Fill #1

## 2021-11-10 MED FILL — HYDROXYCHLOROQUINE 200 MG TABLET: ORAL | 30 days supply | Qty: 60 | Fill #1

## 2021-11-10 MED FILL — FUROSEMIDE 40 MG TABLET: ORAL | 30 days supply | Qty: 30 | Fill #1

## 2021-11-10 MED FILL — FEROSUL 325 MG (65 MG IRON) TABLET: ORAL | 30 days supply | Qty: 30 | Fill #1

## 2021-11-10 MED FILL — AMITRIPTYLINE 25 MG TABLET: ORAL | 30 days supply | Qty: 30 | Fill #1

## 2021-11-10 MED FILL — SIMVASTATIN 40 MG TABLET: ORAL | 30 days supply | Qty: 30 | Fill #1

## 2021-11-21 DIAGNOSIS — L501 Idiopathic urticaria: Principal | ICD-10-CM

## 2021-11-27 ENCOUNTER — Ambulatory Visit: Admit: 2021-11-27 | Discharge: 2021-11-28

## 2021-11-27 MED FILL — HYDROXYZINE HCL 10 MG TABLET: ORAL | 10 days supply | Qty: 30 | Fill #1

## 2021-12-01 ENCOUNTER — Ambulatory Visit: Admit: 2021-12-01 | Discharge: 2021-12-02

## 2021-12-01 DIAGNOSIS — L501 Idiopathic urticaria: Principal | ICD-10-CM

## 2021-12-09 ENCOUNTER — Ambulatory Visit: Admit: 2021-12-09 | Discharge: 2021-12-09

## 2021-12-09 DIAGNOSIS — I7770 Dissection of unspecified artery: Principal | ICD-10-CM

## 2021-12-09 DIAGNOSIS — E785 Hyperlipidemia, unspecified: Principal | ICD-10-CM

## 2021-12-09 DIAGNOSIS — F0631 Mood disorder due to known physiological condition with depressive features: Principal | ICD-10-CM

## 2021-12-09 DIAGNOSIS — D508 Other iron deficiency anemias: Principal | ICD-10-CM

## 2021-12-09 DIAGNOSIS — I639 Cerebral infarction, unspecified: Principal | ICD-10-CM

## 2021-12-09 DIAGNOSIS — R7303 Prediabetes: Principal | ICD-10-CM

## 2021-12-09 DIAGNOSIS — E162 Hypoglycemia, unspecified: Principal | ICD-10-CM

## 2021-12-09 DIAGNOSIS — G44329 Chronic post-traumatic headache, not intractable: Principal | ICD-10-CM

## 2021-12-09 DIAGNOSIS — M5412 Radiculopathy, cervical region: Principal | ICD-10-CM

## 2021-12-09 MED ORDER — AMITRIPTYLINE 25 MG TABLET
ORAL_TABLET | Freq: Every evening | ORAL | 6 refills | 45 days | Status: CP
Start: 2021-12-09 — End: 2022-12-09
  Filled 2022-01-08: qty 180, 90d supply, fill #0

## 2021-12-16 ENCOUNTER — Ambulatory Visit: Admit: 2021-12-16

## 2022-01-05 ENCOUNTER — Ambulatory Visit: Admit: 2022-01-05 | Discharge: 2022-01-06

## 2022-01-06 ENCOUNTER — Telehealth: Admit: 2022-01-06 | Discharge: 2022-01-07

## 2022-01-06 DIAGNOSIS — R768 Other specified abnormal immunological findings in serum: Principal | ICD-10-CM

## 2022-01-06 DIAGNOSIS — J019 Acute sinusitis, unspecified: Principal | ICD-10-CM

## 2022-01-06 DIAGNOSIS — N760 Acute vaginitis: Principal | ICD-10-CM

## 2022-01-06 DIAGNOSIS — B9689 Other specified bacterial agents as the cause of diseases classified elsewhere: Principal | ICD-10-CM

## 2022-01-06 DIAGNOSIS — I639 Cerebral infarction, unspecified: Principal | ICD-10-CM

## 2022-01-06 MED ORDER — AMOXICILLIN 875 MG-POTASSIUM CLAVULANATE 125 MG TABLET
ORAL_TABLET | Freq: Two times a day (BID) | ORAL | 0 refills | 10 days | Status: CP
Start: 2022-01-06 — End: 2022-01-16

## 2022-01-06 MED ORDER — METRONIDAZOLE 0.75 % (37.5 MG/5 GRAM) VAGINAL GEL
Freq: Every day | VAGINAL | 0 refills | 5 days | Status: CP
Start: 2022-01-06 — End: 2022-01-11
  Filled 2022-01-08: qty 70, 5d supply, fill #0

## 2022-01-08 MED FILL — HYDROXYCHLOROQUINE 200 MG TABLET: ORAL | 90 days supply | Qty: 180 | Fill #2

## 2022-01-08 MED FILL — EPINEPHRINE 0.3 MG/0.3 ML INJECTION, AUTO-INJECTOR: INTRAMUSCULAR | 2 days supply | Qty: 2 | Fill #1

## 2022-01-08 MED FILL — FUROSEMIDE 40 MG TABLET: ORAL | 90 days supply | Qty: 90 | Fill #2

## 2022-01-08 MED FILL — FLUTICASONE PROPIONATE 50 MCG/ACTUATION NASAL SPRAY,SUSPENSION: NASAL | 30 days supply | Qty: 32 | Fill #1

## 2022-01-08 MED FILL — CETIRIZINE 10 MG TABLET: ORAL | 30 days supply | Qty: 240 | Fill #2

## 2022-01-08 MED FILL — ASCORBIC ACID (VITAMIN C) 250 MG CHEWABLE TABLET: ORAL | 100 days supply | Qty: 100 | Fill #1

## 2022-01-08 MED FILL — SIMVASTATIN 40 MG TABLET: ORAL | 90 days supply | Qty: 90 | Fill #2

## 2022-01-08 MED FILL — FEROSUL 325 MG (65 MG IRON) TABLET: ORAL | 90 days supply | Qty: 90 | Fill #2

## 2022-01-08 MED FILL — HYDROXYZINE HCL 10 MG TABLET: ORAL | 10 days supply | Qty: 30 | Fill #2

## 2022-01-10 ENCOUNTER — Encounter (HOSPITAL_COMMUNITY): Payer: Self-pay

## 2022-01-10 ENCOUNTER — Emergency Department (HOSPITAL_COMMUNITY)
Admission: EM | Admit: 2022-01-10 | Discharge: 2022-01-10 | Disposition: A | Discharge status: Home / Self Care | Payer: Medicaid Other | Attending: Emergency Medicine | Admitting: Emergency Medicine

## 2022-01-10 ENCOUNTER — Other Ambulatory Visit: Payer: Self-pay

## 2022-01-10 DIAGNOSIS — H5442A5 Blindness left eye category 5, normal vision right eye: Secondary | ICD-10-CM | POA: Insufficient documentation

## 2022-01-10 DIAGNOSIS — H1032 Unspecified acute conjunctivitis, left eye: Secondary | ICD-10-CM

## 2022-01-10 DIAGNOSIS — H1089 Other conjunctivitis: Secondary | ICD-10-CM | POA: Insufficient documentation

## 2022-01-10 MED ORDER — TETRACAINE HCL 0.5 % OP SOLN
2.0000 [drp] | Freq: Once | OPHTHALMIC | Status: AC
  Administered 2022-01-10: 2 [drp] via OPHTHALMIC
  Filled 2022-01-10: qty 4

## 2022-01-10 MED ORDER — FLUORESCEIN SODIUM 1 MG OP STRP
2.0000 | ORAL_STRIP | Freq: Once | OPHTHALMIC | Status: AC
  Administered 2022-01-10: 2 via OPHTHALMIC
  Filled 2022-01-10: qty 2

## 2022-01-10 MED ORDER — ERYTHROMYCIN 5 MG/GM OP OINT
1.0000 "application " | TOPICAL_OINTMENT | Freq: Once | OPHTHALMIC | Status: AC
  Administered 2022-01-10: 1 via OPHTHALMIC
  Filled 2022-01-10: qty 3.5

## 2022-01-10 MED ORDER — ERYTHROMYCIN 5 MG/GM OP OINT: TOPICAL_OINTMENT | OPHTHALMIC | 0 refills | Status: AC

## 2022-01-10 MED ORDER — ACETAMINOPHEN 325 MG PO TABS
650.0000 mg | ORAL_TABLET | Freq: Once | ORAL | Status: AC
  Administered 2022-01-10: 650 mg via ORAL
  Filled 2022-01-10: qty 2

## 2022-01-10 NOTE — ED Triage Notes (Signed)
Pt complaining of redness and draining that started 2 days ago. Is more painful today. Does crust over in the am. ?

## 2022-01-10 NOTE — ED Provider Notes (Signed)
?[facility] EMERGENCY DEPARTMENT ?Provider Note ? ? ?CSN: [id number] ?Arrival date & time: 01/10/22  2044 ? ?  ? ?History ? ?Chief Complaint  ?Patient presents with  ? Conjunctivitis  ? ? ?Sandra Barber is a 41 y.o. female for evaluation of left eye drainage.  Began 2 days ago.  Noted some pruritus, yellow watery drainage.  Sensation of foreign body.  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  No glasses or contact use.  No headache, pain to the eye itself.  No rashes.  No known traumatic injury.  Had a friend that she was around earlier in the week who had similar symptoms.  She denies any swelling surrounding her eye, redness or warmth.  No recent upper respiratory infection.  No neck pain, fever. ? ?HPI ? ?  ? ?Home Medications ?Prior to Admission medications   ?Medication Sig Start Date End Date Taking? Authorizing Provider  ?erythromycin ophthalmic ointment Place a 1/2 inch ribbon of ointment into the LEFT lower eyelid twice daily 01/10/22  Yes Elspeth Blucher A, PA-C  ?ibuprofen (ADVIL) 600 MG tablet Take 1 tablet (600 mg total) by mouth 3 (three) times daily. 06/02/21   Burgess Amor, PA-C  ?   ? ?Allergies    ?Sulfa antibiotics   ? ?Review of Systems   ?Review of Systems  ?Constitutional: Negative.   ?HENT: Negative.    ?Eyes:  Positive for discharge, redness, itching and visual disturbance (chronic blindness to left eye). Negative for pain.  ?Respiratory: Negative.    ?Cardiovascular: Negative.   ?Gastrointestinal: Negative.   ?Genitourinary: Negative.   ?Musculoskeletal: Negative.   ?Skin: Negative.   ?Neurological: Negative.   ?All other systems reviewed and are negative. ? ?Physical Exam ?Updated Vital Signs ?BP 123/87   Pulse (!) 112   Temp 98.2 ?F (36.8 ?C)   Resp 16   Ht 5\' 6"  (1.676 m)   Wt 90.7 kg   LMP 12/29/2021 (Approximate)   SpO2 96%   BMI 32.28 kg/m?  ?Physical Exam ?Vitals and nursing note reviewed.  ?Constitutional:   ?   General: She is not in  acute distress. ?   Appearance: She is well-developed. She is not ill-appearing, toxic-appearing or diaphoretic.  ?HENT:  ?   Head: Normocephalic and atraumatic. No right periorbital erythema or left periorbital erythema.  ?   Comments: No periorbital edema, erythema or warmth. ?   Mouth/Throat:  ?   Lips: Pink.  ?   Mouth: Mucous membranes are moist.  ?Eyes:  ?   General: Lids are everted, no foreign bodies appreciated. Visual field deficit (blindness left eye) present. No allergic shiner.    ?   Right eye: No foreign body, discharge or hordeolum.     ?   Left eye: Discharge present.No foreign body or hordeolum.  ?   Intraocular pressure: Right eye pressure is 14 mmHg. Left eye pressure is 11 mmHg.  ?   Extraocular Movements: Extraocular movements intact.  ?   Conjunctiva/sclera:  ?   Right eye: Right conjunctiva is not injected. No chemosis, exudate or hemorrhage. ?   Left eye: Left conjunctiva is injected. Exudate present. No chemosis or hemorrhage. ?   Pupils: Pupils are equal, round, and reactive to light.  ?   Comments: Right eye sclera clear, PERRLA, IOP 14 ?Left eye injected sclera, yellow matting and crusting at eyelashes.  No periorbital erythema, warmth.  No pain with eye movement  ?Neck:  ?  Trachea: Trachea and phonation normal.  ?   Comments: No neck stiffness or neck rigidity ?Cardiovascular:  ?   Rate and Rhythm: Normal rate.  ?Pulmonary:  ?   Effort: No respiratory distress.  ?Abdominal:  ?   General: There is no distension.  ?Musculoskeletal:     ?   General: Normal range of motion.  ?   Cervical back: Full passive range of motion without pain and normal range of motion.  ?Skin: ?   General: Skin is warm and dry.  ?   Capillary Refill: Capillary refill takes less than 2 seconds.  ?   Comments: No rash or lesions  ?Neurological:  ?   General: No focal deficit present.  ?   Mental Status: She is alert.  ?   Cranial Nerves: No facial asymmetry.  ?   Sensory: Sensation is intact.  ?   Motor: Motor  function is intact.  ?   Comments: No facial droop ?  ?Psychiatric:     ?   Mood and Affect: Mood normal.  ? ? ?ED Results / Procedures / Treatments   ?Labs ?(all labs ordered are listed, but only abnormal results are displayed) ?Labs Reviewed - No data to display ? ?EKG ?None ? ?Radiology ?No results found. ? ?Procedures ?Procedures  ? ? ?Medications Ordered in ED ?Medications  ?tetracaine (PONTOCAINE) 0.5 % ophthalmic solution 2 drop (has no administration in time range)  ?fluorescein ophthalmic strip 2 strip (has no administration in time range)  ?erythromycin ophthalmic ointment 1 application. (has no administration in time range)  ? ? ?ED Course/ Medical Decision Making/ A&P ?  ? ?41 year old here for evaluation of left eye pruritus, injected sclera and yellow matting after being exposed to someone with bacterial conjunctivitis.  She has no evidence of periorbital or orbital cellulitis on exam.  She does have chronic blindness to her left eye from CVA last year.  No glasses or contact he will need to use.  IOP's within normal limits.  No headache to suggest acute angle glaucoma.   ?No corneal abrasions, entrapment, consensual photophobia, or dendritic staining with fluorescein study.  Presentation non-concerning for iritis, corneal abrasions, or HSV.  No evidence of preseptal or orbital cellulitis. Patient will be given erythromycin ophthalmic.  Personal hygiene and frequent handwashing discussed.  Patient advised to followup with ophthalmologist for reevaluation in several days. ? ?The patient has been appropriately medically screened and/or stabilized in the ED. I have low suspicion for any other emergent medical condition which would require further screening, evaluation or treatment in the ED or require inpatient management. ? ?Patient is hemodynamically stable and in no acute distress.  Patient able to ambulate in department prior to ED.  Evaluation does not show acute pathology that would require ongoing  or additional emergent interventions while in the emergency department or further inpatient treatment.  I have discussed the diagnosis with the patient and answered all questions.  Pain is been managed while in the emergency department and patient has no further complaints prior to discharge.  Patient is comfortable with plan discussed in room and is stable for discharge at this time.  I have discussed strict return precautions for returning to the emergency department.  Patient was encouraged to follow-up with PCP/specialist refer to at discharge.  ? ?  ?                        ?Medical Decision Making ?Amount and/or Complexity of  Data Reviewed ?External Data Reviewed: labs, radiology and notes. ? ?Risk ?OTC drugs. ?Prescription drug management. ?Diagnosis or treatment significantly limited by social determinants of health. ? ? ? ? ? ? ? ? ? ?Final Clinical Impression(s) / ED Diagnoses ?Final diagnoses:  ?Acute bacterial conjunctivitis of left eye  ? ? ?Rx / DC Orders ?ED Discharge Orders   ? ?      Ordered  ?  erythromycin ophthalmic ointment       ? 01/10/22 2138  ? ?  ?  ? ?  ? ? ?  ?Eiden Bagot A, PA-C ?01/10/22 2140 ? ?  ?Cathren Laine, MD ?01/11/22 1624 ? ?

## 2022-01-10 NOTE — ED Notes (Signed)
Tonopen, drops, strips, and woods lamp at bedside.  ?

## 2022-01-10 NOTE — Discharge Instructions (Addendum)
Follow-up with ophthalmologist on Monday.  If you do not have one there is one listed in your discharge paperwork.  Please call to schedule appointment. If you call first thing Monday morning they can typically get you in same day or next day ? ?Use ointment as prescribed. ? ?Make sure to wash your hands frequently and not come in contact with your other eye as this may spread ? ?Return for new or worsening symptoms ?

## 2022-01-14 MED ORDER — POLYMYXIN B SULFATE 10,000 UNIT-TRIMETHOPRIM 1 MG/ML EYE DROPS
Freq: Four times a day (QID) | OPHTHALMIC | 0 refills | 25.00000 days | Status: CP
Start: 2022-01-14 — End: 2022-01-21

## 2022-01-22 ENCOUNTER — Institutional Professional Consult (permissible substitution): Admit: 2022-01-22 | Discharge: 2022-01-22

## 2022-01-22 ENCOUNTER — Ambulatory Visit: Admit: 2022-01-22 | Discharge: 2022-01-22

## 2022-01-22 DIAGNOSIS — L501 Idiopathic urticaria: Principal | ICD-10-CM

## 2022-01-22 DIAGNOSIS — Z86018 Personal history of other benign neoplasm: Principal | ICD-10-CM

## 2022-01-22 DIAGNOSIS — I10 Essential (primary) hypertension: Principal | ICD-10-CM

## 2022-01-22 DIAGNOSIS — N939 Abnormal uterine and vaginal bleeding, unspecified: Principal | ICD-10-CM

## 2022-01-22 MED ORDER — LISINOPRIL 10 MG TABLET
ORAL_TABLET | Freq: Every day | ORAL | 11 refills | 30 days | Status: CP
Start: 2022-01-22 — End: 2023-01-22
  Filled 2022-01-30: qty 30, 30d supply, fill #0

## 2022-01-22 MED ORDER — NORETHINDRONE (CONTRACEPTIVE) 0.35 MG TABLET
ORAL_TABLET | Freq: Every day | ORAL | 3 refills | 84 days | Status: CP
Start: 2022-01-22 — End: 2023-01-22
  Filled 2022-01-30: qty 84, 84d supply, fill #0

## 2022-01-30 DIAGNOSIS — B9689 Other specified bacterial agents as the cause of diseases classified elsewhere: Principal | ICD-10-CM

## 2022-01-30 DIAGNOSIS — J019 Acute sinusitis, unspecified: Principal | ICD-10-CM

## 2022-01-30 MED ORDER — DOXYCYCLINE HYCLATE 100 MG CAPSULE
ORAL_CAPSULE | Freq: Two times a day (BID) | ORAL | 0 refills | 7.00000 days | Status: CP
Start: 2022-01-30 — End: 2022-02-06

## 2022-02-23 ENCOUNTER — Ambulatory Visit
Admit: 2022-02-23 | Attending: Student in an Organized Health Care Education/Training Program | Primary: Student in an Organized Health Care Education/Training Program

## 2022-02-23 ENCOUNTER — Ambulatory Visit: Admit: 2022-02-23

## 2022-03-10 ENCOUNTER — Institutional Professional Consult (permissible substitution): Admit: 2022-03-10 | Discharge: 2022-03-11

## 2022-03-10 DIAGNOSIS — L501 Idiopathic urticaria: Principal | ICD-10-CM

## 2022-03-10 MED ORDER — HYDROXYZINE HCL 10 MG TABLET
ORAL_TABLET | Freq: Three times a day (TID) | ORAL | 2 refills | 10 days | Status: CP | PRN
Start: 2022-03-10 — End: 2023-03-10
  Filled 2022-03-11: qty 90, 30d supply, fill #0

## 2022-03-11 MED FILL — LISINOPRIL 10 MG TABLET: ORAL | 90 days supply | Qty: 90 | Fill #1

## 2022-03-11 MED FILL — CETIRIZINE 10 MG TABLET: ORAL | 30 days supply | Qty: 240 | Fill #3

## 2022-03-30 ENCOUNTER — Ambulatory Visit
Admit: 2022-03-30 | Discharge: 2022-03-31 | Payer: MEDICAID | Attending: Student in an Organized Health Care Education/Training Program | Primary: Student in an Organized Health Care Education/Training Program

## 2022-03-30 DIAGNOSIS — N939 Abnormal uterine and vaginal bleeding, unspecified: Principal | ICD-10-CM

## 2022-03-30 DIAGNOSIS — Z86018 Personal history of other benign neoplasm: Principal | ICD-10-CM

## 2022-03-30 DIAGNOSIS — Z113 Encounter for screening for infections with a predominantly sexual mode of transmission: Principal | ICD-10-CM

## 2022-03-30 MED ORDER — NORETHINDRONE ACETATE 5 MG TABLET
ORAL_TABLET | Freq: Every day | ORAL | 11 refills | 15 days | Status: CP
Start: 2022-03-30 — End: 2023-03-30
  Filled 2022-04-01: qty 30, 15d supply, fill #0

## 2022-03-31 DIAGNOSIS — J309 Allergic rhinitis, unspecified: Principal | ICD-10-CM

## 2022-04-01 MED ORDER — FLUTICASONE PROPIONATE 50 MCG/ACTUATION NASAL SPRAY,SUSPENSION
Freq: Two times a day (BID) | NASAL | 2 refills | 30 days | Status: CP
Start: 2022-04-01 — End: 2023-04-01
  Filled 2022-04-01: qty 16, 30d supply, fill #0

## 2022-04-17 ENCOUNTER — Institutional Professional Consult (permissible substitution): Admit: 2022-04-17 | Discharge: 2022-04-17

## 2022-04-17 ENCOUNTER — Ambulatory Visit: Admit: 2022-04-17 | Discharge: 2022-04-17

## 2022-04-17 DIAGNOSIS — L501 Idiopathic urticaria: Principal | ICD-10-CM

## 2022-04-17 DIAGNOSIS — E042 Nontoxic multinodular goiter: Principal | ICD-10-CM

## 2022-04-17 DIAGNOSIS — E079 Disorder of thyroid, unspecified: Principal | ICD-10-CM

## 2022-04-17 DIAGNOSIS — E0500 Thyrotoxicosis with diffuse goiter without thyrotoxic crisis or storm: Principal | ICD-10-CM

## 2022-04-17 DIAGNOSIS — N939 Abnormal uterine and vaginal bleeding, unspecified: Principal | ICD-10-CM

## 2022-04-17 DIAGNOSIS — R7989 Other specified abnormal findings of blood chemistry: Principal | ICD-10-CM

## 2022-04-17 DIAGNOSIS — H5789 Other specified disorders of eye and adnexa: Principal | ICD-10-CM

## 2022-04-17 MED FILL — FUROSEMIDE 40 MG TABLET: ORAL | 30 days supply | Qty: 30 | Fill #3

## 2022-04-17 MED FILL — AMITRIPTYLINE 25 MG TABLET: ORAL | 90 days supply | Qty: 180 | Fill #1

## 2022-04-17 MED FILL — CETIRIZINE 10 MG TABLET: ORAL | 30 days supply | Qty: 240 | Fill #4

## 2022-05-26 ENCOUNTER — Ambulatory Visit: Admit: 2022-05-26 | Discharge: 2022-05-27

## 2022-05-26 ENCOUNTER — Ambulatory Visit
Admit: 2022-05-26 | Discharge: 2022-05-27 | Attending: Student in an Organized Health Care Education/Training Program | Primary: Student in an Organized Health Care Education/Training Program

## 2022-05-26 DIAGNOSIS — L501 Idiopathic urticaria: Principal | ICD-10-CM

## 2022-05-27 MED ORDER — XOLAIR 150 MG/ML SUBCUTANEOUS SYRINGE
SUBCUTANEOUS | 11 refills | 28 days | Status: CP
Start: 2022-05-27 — End: ?

## 2022-05-28 ENCOUNTER — Telehealth
Admit: 2022-05-28 | Discharge: 2022-05-29 | Payer: MEDICAID | Attending: Student in an Organized Health Care Education/Training Program | Primary: Student in an Organized Health Care Education/Training Program

## 2022-05-29 MED FILL — FLUTICASONE PROPIONATE 50 MCG/ACTUATION NASAL SPRAY,SUSPENSION: NASAL | 30 days supply | Qty: 16 | Fill #1

## 2022-05-29 MED FILL — CETIRIZINE 10 MG TABLET: ORAL | 30 days supply | Qty: 240 | Fill #5

## 2022-05-29 MED FILL — NORETHINDRONE ACETATE 5 MG TABLET: ORAL | 15 days supply | Qty: 30 | Fill #1

## 2022-06-11 ENCOUNTER — Institutional Professional Consult (permissible substitution): Admit: 2022-06-11 | Discharge: 2022-06-12 | Payer: MEDICAID

## 2022-06-11 DIAGNOSIS — B9689 Other specified bacterial agents as the cause of diseases classified elsewhere: Principal | ICD-10-CM

## 2022-06-11 DIAGNOSIS — N760 Acute vaginitis: Principal | ICD-10-CM

## 2022-06-11 DIAGNOSIS — L501 Idiopathic urticaria: Principal | ICD-10-CM

## 2022-06-11 MED ORDER — METRONIDAZOLE 500 MG TABLET
ORAL_TABLET | Freq: Two times a day (BID) | ORAL | 0 refills | 7 days | Status: CP
Start: 2022-06-11 — End: 2022-06-18

## 2022-06-12 DIAGNOSIS — B9689 Other specified bacterial agents as the cause of diseases classified elsewhere: Principal | ICD-10-CM

## 2022-06-12 DIAGNOSIS — N760 Acute vaginitis: Principal | ICD-10-CM

## 2022-06-12 MED ORDER — METRONIDAZOLE 500 MG TABLET
ORAL_TABLET | Freq: Two times a day (BID) | ORAL | 0 refills | 7 days | Status: CP
Start: 2022-06-12 — End: 2022-06-19

## 2022-06-15 MED FILL — SIMVASTATIN 40 MG TABLET: ORAL | 90 days supply | Qty: 90 | Fill #3

## 2022-06-15 MED FILL — LISINOPRIL 10 MG TABLET: ORAL | 90 days supply | Qty: 90 | Fill #2

## 2022-06-15 MED FILL — NORETHINDRONE ACETATE 5 MG TABLET: ORAL | 15 days supply | Qty: 30 | Fill #2

## 2022-06-16 MED ORDER — CYCLOBENZAPRINE 10 MG TABLET
ORAL_TABLET | Freq: Three times a day (TID) | ORAL | 0 refills | 10 days | Status: CP | PRN
Start: 2022-06-16 — End: ?
  Filled 2022-08-25: qty 30, 10d supply, fill #0

## 2022-06-17 ENCOUNTER — Ambulatory Visit: Admit: 2022-06-17 | Payer: MEDICAID

## 2022-06-29 MED ORDER — HYDROXYZINE HCL 10 MG TABLET
ORAL_TABLET | Freq: Three times a day (TID) | ORAL | 2 refills | 20 days | Status: CP | PRN
Start: 2022-06-29 — End: 2023-06-29
  Filled 2022-07-01: qty 60, 20d supply, fill #0

## 2022-06-29 MED ORDER — FUROSEMIDE 40 MG TABLET
ORAL_TABLET | Freq: Every day | ORAL | 5 refills | 30 days | Status: CP | PRN
Start: 2022-06-29 — End: ?
  Filled 2022-07-01: qty 90, 90d supply, fill #0

## 2022-06-30 ENCOUNTER — Ambulatory Visit: Admit: 2022-06-30

## 2022-06-30 ENCOUNTER — Ambulatory Visit: Admit: 2022-06-30 | Attending: Internal Medicine | Primary: Internal Medicine

## 2022-07-01 MED FILL — NORETHINDRONE ACETATE 5 MG TABLET: ORAL | 15 days supply | Qty: 30 | Fill #3

## 2022-07-01 MED FILL — CETIRIZINE 10 MG TABLET: ORAL | 30 days supply | Qty: 240 | Fill #6

## 2022-07-01 MED FILL — FLUTICASONE PROPIONATE 50 MCG/ACTUATION NASAL SPRAY,SUSPENSION: NASAL | 30 days supply | Qty: 16 | Fill #2

## 2022-07-01 MED FILL — ASCORBIC ACID (VITAMIN C) 250 MG CHEWABLE TABLET: ORAL | 100 days supply | Qty: 100 | Fill #2

## 2022-07-01 MED FILL — CICLOPIROX 8 % TOPICAL SOLUTION: TOPICAL | 30 days supply | Qty: 6.6 | Fill #0

## 2022-07-01 MED FILL — HYDROXYCHLOROQUINE 200 MG TABLET: ORAL | 90 days supply | Qty: 180 | Fill #3

## 2022-07-01 MED FILL — FEROSUL 325 MG (65 MG IRON) TABLET: ORAL | 90 days supply | Qty: 90 | Fill #3

## 2022-07-14 ENCOUNTER — Ambulatory Visit
Admit: 2022-07-14 | Discharge: 2022-07-15 | Attending: Student in an Organized Health Care Education/Training Program | Primary: Student in an Organized Health Care Education/Training Program

## 2022-07-14 DIAGNOSIS — E0500 Thyrotoxicosis with diffuse goiter without thyrotoxic crisis or storm: Principal | ICD-10-CM

## 2022-07-15 MED FILL — AMITRIPTYLINE 25 MG TABLET: ORAL | 90 days supply | Qty: 180 | Fill #2

## 2022-07-15 MED FILL — NORETHINDRONE ACETATE 5 MG TABLET: ORAL | 15 days supply | Qty: 30 | Fill #4

## 2022-07-15 MED FILL — HYDROXYZINE HCL 10 MG TABLET: ORAL | 20 days supply | Qty: 60 | Fill #1

## 2022-07-29 DIAGNOSIS — I69320 Aphasia following cerebral infarction: Principal | ICD-10-CM

## 2022-07-30 DIAGNOSIS — J309 Allergic rhinitis, unspecified: Principal | ICD-10-CM

## 2022-07-31 MED ORDER — FLUTICASONE PROPIONATE 50 MCG/ACTUATION NASAL SPRAY,SUSPENSION
Freq: Two times a day (BID) | NASAL | 2 refills | 30 days | Status: CP
Start: 2022-07-31 — End: 2023-07-31
  Filled 2022-07-31: qty 16, 15d supply, fill #0

## 2022-07-31 MED FILL — CETIRIZINE 10 MG TABLET: ORAL | 30 days supply | Qty: 240 | Fill #7

## 2022-07-31 MED FILL — NORETHINDRONE ACETATE 5 MG TABLET: ORAL | 15 days supply | Qty: 30 | Fill #5

## 2022-08-17 ENCOUNTER — Ambulatory Visit: Admit: 2022-08-17

## 2022-08-17 ENCOUNTER — Ambulatory Visit: Admit: 2022-08-17 | Attending: Internal Medicine | Primary: Internal Medicine

## 2022-08-21 DIAGNOSIS — D219 Benign neoplasm of connective and other soft tissue, unspecified: Principal | ICD-10-CM

## 2022-08-25 MED FILL — CETIRIZINE 10 MG TABLET: ORAL | 30 days supply | Qty: 240 | Fill #8

## 2022-08-25 MED FILL — FLUTICASONE PROPIONATE 50 MCG/ACTUATION NASAL SPRAY,SUSPENSION: NASAL | 15 days supply | Qty: 16 | Fill #1

## 2022-08-25 MED FILL — EPINEPHRINE 0.3 MG/0.3 ML INJECTION, AUTO-INJECTOR: INTRAMUSCULAR | 2 days supply | Qty: 2 | Fill #2

## 2022-08-25 MED FILL — NORETHINDRONE ACETATE 5 MG TABLET: ORAL | 15 days supply | Qty: 30 | Fill #6

## 2022-09-11 DIAGNOSIS — L501 Idiopathic urticaria: Principal | ICD-10-CM

## 2022-09-11 MED ORDER — XOLAIR 150 MG/ML SUBCUTANEOUS SYRINGE
SUBCUTANEOUS | 11 refills | 28 days | Status: CP
Start: 2022-09-11 — End: ?
  Filled 2022-09-22: qty 2, 28d supply, fill #0

## 2022-09-14 ENCOUNTER — Ambulatory Visit: Admit: 2022-09-14 | Payer: BLUE CROSS/BLUE SHIELD

## 2022-09-14 DIAGNOSIS — L501 Idiopathic urticaria: Principal | ICD-10-CM

## 2022-09-17 ENCOUNTER — Ambulatory Visit: Admit: 2022-09-17 | Discharge: 2022-09-18 | Payer: BLUE CROSS/BLUE SHIELD

## 2022-09-17 DIAGNOSIS — L508 Other urticaria: Principal | ICD-10-CM

## 2022-09-17 DIAGNOSIS — I639 Cerebral infarction, unspecified: Principal | ICD-10-CM

## 2022-09-17 LAB — CBC
HEMATOCRIT: 38.5 % (ref 34.0–44.0)
HEMOGLOBIN: 12.8 g/dL (ref 11.3–14.9)
MEAN CORPUSCULAR HEMOGLOBIN CONC: 33.2 g/dL (ref 32.0–36.0)
MEAN CORPUSCULAR HEMOGLOBIN: 29.3 pg (ref 25.9–32.4)
MEAN CORPUSCULAR VOLUME: 88.1 fL (ref 77.6–95.7)
MEAN PLATELET VOLUME: 6.8 fL (ref 6.8–10.7)
PLATELET COUNT: 355 10*9/L (ref 150–450)
RED BLOOD CELL COUNT: 4.37 10*12/L (ref 3.95–5.13)
RED CELL DISTRIBUTION WIDTH: 15.5 % — ABNORMAL HIGH (ref 12.2–15.2)
WBC ADJUSTED: 4.5 10*9/L (ref 3.6–11.2)

## 2022-09-17 MED ORDER — EPINEPHRINE 0.3 MG/0.3 ML INJECTION, AUTO-INJECTOR
Freq: Once | INTRAMUSCULAR | 4 refills | 2 days | Status: CP
Start: 2022-09-17 — End: 2022-09-17

## 2022-09-17 MED ORDER — FERROUS SULFATE 325 MG (65 MG IRON) TABLET
ORAL_TABLET | Freq: Every day | ORAL | 4 refills | 30 days | Status: CP
Start: 2022-09-17 — End: 2023-09-17
  Filled 2022-09-22: qty 30, 30d supply, fill #0

## 2022-09-17 MED ORDER — SIMVASTATIN 40 MG TABLET
ORAL_TABLET | Freq: Every evening | ORAL | 4 refills | 30 days | Status: CP
Start: 2022-09-17 — End: ?

## 2022-09-18 ENCOUNTER — Ambulatory Visit: Admit: 2022-09-18 | Discharge: 2022-09-19 | Payer: BLUE CROSS/BLUE SHIELD

## 2022-09-18 ENCOUNTER — Encounter
Admit: 2022-09-18 | Discharge: 2022-09-19 | Payer: BLUE CROSS/BLUE SHIELD | Attending: Anesthesiology | Primary: Anesthesiology

## 2022-09-18 LAB — CBC
HEMATOCRIT: 38.3 % (ref 34.0–44.0)
HEMATOCRIT: 32 % — ABNORMAL LOW (ref 34.0–44.0)
HEMATOCRIT: 36.2 % (ref 34.0–44.0)
HEMOGLOBIN: 12.5 g/dL (ref 11.3–14.9)
HEMOGLOBIN: 10.4 g/dL — ABNORMAL LOW (ref 11.3–14.9)
HEMOGLOBIN: 11.8 g/dL (ref 11.3–14.9)
MEAN CORPUSCULAR HEMOGLOBIN CONC: 32.6 g/dL (ref 32.0–36.0)
MEAN CORPUSCULAR HEMOGLOBIN CONC: 32.6 g/dL (ref 32.0–36.0)
MEAN CORPUSCULAR HEMOGLOBIN CONC: 32.6 g/dL (ref 32.0–36.0)
MEAN CORPUSCULAR HEMOGLOBIN: 29 pg (ref 25.9–32.4)
MEAN CORPUSCULAR HEMOGLOBIN: 28.8 pg (ref 25.9–32.4)
MEAN CORPUSCULAR HEMOGLOBIN: 29 pg (ref 25.9–32.4)
MEAN CORPUSCULAR VOLUME: 88.8 fL (ref 77.6–95.7)
MEAN CORPUSCULAR VOLUME: 88.4 fL (ref 77.6–95.7)
MEAN CORPUSCULAR VOLUME: 88.9 fL (ref 77.6–95.7)
MEAN PLATELET VOLUME: 7 fL (ref 6.8–10.7)
MEAN PLATELET VOLUME: 6.8 fL (ref 6.8–10.7)
MEAN PLATELET VOLUME: 6.9 fL (ref 6.8–10.7)
PLATELET COUNT: 371 10*9/L (ref 150–450)
PLATELET COUNT: 246 10*9/L (ref 150–450)
PLATELET COUNT: 196 10*9/L (ref 150–450)
RED BLOOD CELL COUNT: 4.31 10*12/L (ref 3.95–5.13)
RED BLOOD CELL COUNT: 3.62 10*12/L — ABNORMAL LOW (ref 3.95–5.13)
RED BLOOD CELL COUNT: 4.07 10*12/L (ref 3.95–5.13)
RED CELL DISTRIBUTION WIDTH: 15.8 % — ABNORMAL HIGH (ref 12.2–15.2)
RED CELL DISTRIBUTION WIDTH: 15.7 % — ABNORMAL HIGH (ref 12.2–15.2)
RED CELL DISTRIBUTION WIDTH: 15.9 % — ABNORMAL HIGH (ref 12.2–15.2)
WBC ADJUSTED: 6.5 10*9/L (ref 3.6–11.2)
WBC ADJUSTED: 14.5 10*9/L — ABNORMAL HIGH (ref 3.6–11.2)
WBC ADJUSTED: 17.4 10*9/L — ABNORMAL HIGH (ref 3.6–11.2)

## 2022-09-18 LAB — BLOOD GAS, VENOUS
BASE EXCESS VENOUS: -2.4 — ABNORMAL LOW (ref -2.0–2.0)
FIO2 VENOUS: 40
HCO3 VENOUS: 21 mmol/L — ABNORMAL LOW (ref 22–27)
O2 SATURATION VENOUS: 50.4 % (ref 40.0–85.0)
OXYHEMOGLOBIN, VENOUS: 49.3 % (ref 40.0–85.0)
PCO2 VENOUS: 50 mmHg (ref 40–60)
PH VENOUS: 7.29 — ABNORMAL LOW (ref 7.32–7.43)
PO2 VENOUS: 33 mmHg — ABNORMAL LOW (ref 35–40)

## 2022-09-18 LAB — PROTIME-INR
INR: 1.23
PROTIME: 13.6 s — ABNORMAL HIGH (ref 9.9–12.6)

## 2022-09-18 MED ORDER — OXYCODONE 5 MG TABLET
ORAL_TABLET | ORAL | 0 refills | 2 days | PRN
Start: 2022-09-18 — End: 2022-09-23

## 2022-09-18 MED ORDER — IBUPROFEN 600 MG TABLET
ORAL_TABLET | Freq: Three times a day (TID) | ORAL | 0 refills | 20 days
Start: 2022-09-18 — End: ?

## 2022-09-18 MED ORDER — ACETAMINOPHEN 325 MG TABLET
ORAL_TABLET | Freq: Four times a day (QID) | ORAL | 0 refills | 8 days | PRN
Start: 2022-09-18 — End: ?

## 2022-09-18 MED ADMIN — midazolam (VERSED) injection: INTRAVENOUS | @ 18:00:00 | Stop: 2022-09-18

## 2022-09-18 MED ADMIN — misoprostol (CYTOTEC) tablet: RECTAL | @ 21:00:00 | Stop: 2022-09-18

## 2022-09-18 MED ADMIN — Propofol (DIPRIVAN) injection: INTRAVENOUS | @ 19:00:00 | Stop: 2022-09-18

## 2022-09-18 MED ADMIN — phenylephrine 0.8 mg/10 mL (80 mcg/mL) injection: INTRAVENOUS | @ 20:00:00 | Stop: 2022-09-18

## 2022-09-18 MED ADMIN — sugammadex (BRIDION) injection: INTRAVENOUS | @ 23:00:00 | Stop: 2022-09-18

## 2022-09-18 MED ADMIN — fentaNYL (PF) (SUBLIMAZE) injection 25 mcg: 25 ug | INTRAVENOUS | @ 23:00:00 | Stop: 2022-09-18

## 2022-09-18 MED ADMIN — phenylephrine 20 mg in sodium chloride 0.9% 250 mL (80 mcg/mL) infusion PMB: INTRAVENOUS | @ 19:00:00 | Stop: 2022-09-18

## 2022-09-18 MED ADMIN — tranexamic acid injection 1,000 mg: 1 g | INTRAVENOUS | @ 21:00:00 | Stop: 2022-09-18

## 2022-09-18 MED ADMIN — vasopressin (VASOSTRICT) 20 Units, sodium chloride (NS) 0.9 % 200 mL: @ 21:00:00 | Stop: 2022-09-18

## 2022-09-18 MED ADMIN — fentaNYL (PF) (SUBLIMAZE) injection: INTRAVENOUS | @ 20:00:00 | Stop: 2022-09-18

## 2022-09-18 MED ADMIN — lidocaine (XYLOCAINE) 20 mg/mL (2 %) injection: INTRAVENOUS | @ 18:00:00 | Stop: 2022-09-18

## 2022-09-18 MED ADMIN — heparin 30,000 units (CELL SAVER) in 1000 mL NS: @ 21:00:00 | Stop: 2022-09-18

## 2022-09-18 MED ADMIN — ondansetron (ZOFRAN) injection: INTRAVENOUS | @ 19:00:00 | Stop: 2022-09-18

## 2022-09-18 MED ADMIN — phenylephrine 0.8 mg/10 mL (80 mcg/mL) injection: INTRAVENOUS | @ 19:00:00 | Stop: 2022-09-18

## 2022-09-18 MED ADMIN — ROCuronium (ZEMURON) injection: INTRAVENOUS | @ 22:00:00 | Stop: 2022-09-18

## 2022-09-18 MED ADMIN — HYDROmorphone (PF) (DILAUDID) injection: INTRAVENOUS | @ 22:00:00 | Stop: 2022-09-18

## 2022-09-18 MED ADMIN — albumin human 5 % 5 % bottle: INTRAVENOUS | @ 21:00:00 | Stop: 2022-09-18

## 2022-09-18 MED ADMIN — HYDROmorphone (PF) (DILAUDID) injection: INTRAVENOUS | @ 23:00:00 | Stop: 2022-09-18

## 2022-09-18 MED ADMIN — ketamine (KETALAR) injection: INTRAVENOUS | @ 19:00:00 | Stop: 2022-09-18

## 2022-09-18 MED ADMIN — esmoloL (BREVIBLOC) injection: INTRAVENOUS | @ 19:00:00 | Stop: 2022-09-18

## 2022-09-18 MED ADMIN — ROCuronium (ZEMURON) injection: INTRAVENOUS | @ 19:00:00 | Stop: 2022-09-18

## 2022-09-18 MED ADMIN — dexmedeTOMIDine (Precedex) 200 mcg in sodium chloride (NS) 0.9 % 50 mL infusion: INTRAVENOUS | @ 20:00:00 | Stop: 2022-09-18

## 2022-09-18 MED ADMIN — dexAMETHasone (DECADRON) 4 mg/mL injection: INTRAVENOUS | @ 19:00:00 | Stop: 2022-09-18

## 2022-09-18 MED ADMIN — Propofol (DIPRIVAN) injection: INTRAVENOUS | @ 23:00:00 | Stop: 2022-09-18

## 2022-09-18 MED ADMIN — lactated Ringers infusion: 10 mL/h | INTRAVENOUS | @ 20:00:00

## 2022-09-18 MED ADMIN — lactated Ringers infusion: 10 mL/h | INTRAVENOUS | @ 17:00:00

## 2022-09-18 MED ADMIN — acetaminophen (TYLENOL) tablet 1,000 mg: 1000 mg | ORAL | @ 17:00:00 | Stop: 2022-09-18

## 2022-09-18 MED ADMIN — matrix hemostatic sealant (FLOSEAL) topical: TOPICAL | @ 22:00:00 | Stop: 2022-09-18

## 2022-09-18 MED ADMIN — albumin human 5 % 5 % bottle: INTRAVENOUS | @ 22:00:00 | Stop: 2022-09-18

## 2022-09-18 MED ADMIN — metoPROLOL (LOPRESSOR) injection: INTRAVENOUS | @ 19:00:00 | Stop: 2022-09-18

## 2022-09-18 MED ADMIN — ceFAZolin (ANCEF) IVPB 2 g in 50 ml dextrose (premix): 2 g | INTRAVENOUS | @ 19:00:00 | Stop: 2022-09-18

## 2022-09-18 MED ADMIN — vasopressin (VASOSTRICT) 20 Units, sodium chloride (NS) 0.9 % 200 mL: @ 22:00:00 | Stop: 2022-09-18

## 2022-09-18 MED ADMIN — bupivacaine LIPOSOMAL (PF) (EXPAREL) 266 mg, sodium chloride (NS) 0.9 % 1 mL infiltration injection: 266 mg | @ 23:00:00 | Stop: 2022-09-18

## 2022-09-18 MED ADMIN — ROCuronium (ZEMURON) injection: INTRAVENOUS | @ 20:00:00 | Stop: 2022-09-18

## 2022-09-18 MED ADMIN — phenylephrine 0.8 mg/10 mL (80 mcg/mL) injection: INTRAVENOUS | @ 22:00:00 | Stop: 2022-09-18

## 2022-09-18 MED ADMIN — ROCuronium (ZEMURON) injection: INTRAVENOUS | @ 21:00:00 | Stop: 2022-09-18

## 2022-09-18 MED ADMIN — tranexamic acid injection 1,000 mg: 1 g | INTRAVENOUS | @ 19:00:00 | Stop: 2022-09-18

## 2022-09-18 MED ADMIN — lactated Ringers infusion: 10 mL/h | INTRAVENOUS | @ 21:00:00

## 2022-09-18 MED ADMIN — oxyCODONE (ROXICODONE) immediate release tablet 5 mg: 5 mg | ORAL | @ 23:00:00 | Stop: 2022-09-18

## 2022-09-18 MED ADMIN — fentaNYL (PF) (SUBLIMAZE) injection: INTRAVENOUS | @ 19:00:00 | Stop: 2022-09-18

## 2022-09-18 MED ADMIN — fentaNYL (PF) (SUBLIMAZE) injection: INTRAVENOUS | @ 18:00:00 | Stop: 2022-09-18

## 2022-09-18 MED ADMIN — phenylephrine 0.8 mg/10 mL (80 mcg/mL) injection: INTRAVENOUS | @ 21:00:00 | Stop: 2022-09-18

## 2022-09-18 NOTE — Unmapped (Signed)
[facility] Specialty Medication Onboarding    Specialty Medication: Xolair  Prior Authorization: Approved   Financial Assistance: No - copay  <$25  Final Copay/Day Supply: $4 / 28    Insurance Restrictions: None     Notes to Pharmacist: Patient previously had mfr assistance. Patient also requested refills via MyChart: Auvi-Q, iron supplement, Flonase, lisinopril, norethindrone, simvastatin (all $4 copay)    The triage team has completed the benefits investigation and has determined that the patient is able to fill this medication at [facility]. Please contact the patient to complete the onboarding or follow up with the prescribing physician as needed.

## 2022-09-19 LAB — CBC
HEMATOCRIT: 31.3 % — ABNORMAL LOW (ref 34.0–44.0)
HEMOGLOBIN: 10.2 g/dL — ABNORMAL LOW (ref 11.3–14.9)
MEAN CORPUSCULAR HEMOGLOBIN CONC: 32.6 g/dL (ref 32.0–36.0)
MEAN CORPUSCULAR HEMOGLOBIN: 28.9 pg (ref 25.9–32.4)
MEAN CORPUSCULAR VOLUME: 88.4 fL (ref 77.6–95.7)
MEAN PLATELET VOLUME: 7 fL (ref 6.8–10.7)
PLATELET COUNT: 178 10*9/L (ref 150–450)
RED BLOOD CELL COUNT: 3.54 10*12/L — ABNORMAL LOW (ref 3.95–5.13)
RED CELL DISTRIBUTION WIDTH: 16.1 % — ABNORMAL HIGH (ref 12.2–15.2)
WBC ADJUSTED: 13.8 10*9/L — ABNORMAL HIGH (ref 3.6–11.2)

## 2022-09-19 MED ORDER — DOCUSATE SODIUM 100 MG CAPSULE
ORAL_CAPSULE | Freq: Every day | ORAL | 0 refills | 30 days | Status: CP | PRN
Start: 2022-09-19 — End: 2022-10-19
  Filled 2022-09-19: qty 30, 30d supply, fill #0

## 2022-09-19 MED ORDER — POLYETHYLENE GLYCOL 3350 17 GRAM ORAL POWDER PACKET
PACK | Freq: Every day | ORAL | 0 refills | 20 days | Status: CP | PRN
Start: 2022-09-19 — End: 2022-10-19
  Filled 2022-09-19: qty 20, 20d supply, fill #0

## 2022-09-19 MED ORDER — IBUPROFEN 600 MG TABLET
ORAL_TABLET | Freq: Three times a day (TID) | ORAL | 0 refills | 20 days | Status: CP
Start: 2022-09-19 — End: ?
  Filled 2022-09-19: qty 60, 20d supply, fill #0

## 2022-09-19 MED ORDER — OXYCODONE 5 MG TABLET
ORAL_TABLET | ORAL | 0 refills | 3 days | Status: CP | PRN
Start: 2022-09-19 — End: 2022-09-24
  Filled 2022-09-19: qty 15, 3d supply, fill #0

## 2022-09-19 MED ORDER — ACETAMINOPHEN 325 MG TABLET
ORAL_TABLET | Freq: Four times a day (QID) | ORAL | 0 refills | 4 days | Status: CP
Start: 2022-09-19 — End: ?
  Filled 2022-09-19: qty 30, 4d supply, fill #0

## 2022-09-19 MED ADMIN — ondansetron (ZOFRAN) injection 4 mg: 4 mg | INTRAVENOUS | @ 02:00:00

## 2022-09-19 MED ADMIN — ibuprofen (MOTRIN) tablet 600 mg: 600 mg | ORAL | @ 11:00:00 | Stop: 2022-09-19

## 2022-09-19 MED ADMIN — dextrose 5 % in lactated ringers infusion: 100 mL/h | INTRAVENOUS | @ 02:00:00

## 2022-09-19 MED ADMIN — ibuprofen (MOTRIN) tablet 600 mg: 600 mg | ORAL | @ 16:00:00 | Stop: 2022-09-19

## 2022-09-19 MED ADMIN — pravastatin (PRAVACHOL) tablet 40 mg: 40 mg | ORAL | @ 02:00:00

## 2022-09-19 MED ADMIN — ibuprofen (MOTRIN) tablet 600 mg: 600 mg | ORAL | @ 05:00:00 | Stop: 2022-09-19

## 2022-09-19 MED ADMIN — amitriptyline (ELAVIL) tablet 50 mg: 50 mg | ORAL | @ 02:00:00

## 2022-09-19 MED ADMIN — acetaminophen (TYLENOL) tablet 650 mg: 650 mg | ORAL | @ 16:00:00 | Stop: 2022-09-19

## 2022-09-19 MED ADMIN — acetaminophen (TYLENOL) tablet 650 mg: 650 mg | ORAL | @ 11:00:00 | Stop: 2022-09-19

## 2022-09-19 MED ADMIN — influenza vaccine quad (FLUARIX, FLULAVAL, FLUZONE) (6 MOS & UP) 2023-24: .5 mL | INTRAMUSCULAR | @ 17:00:00 | Stop: 2022-09-19

## 2022-09-19 MED ADMIN — morphine injection 2 mg: 2 mg | INTRAVENOUS | @ 02:00:00 | Stop: 2022-09-19

## 2022-09-19 MED ADMIN — acetaminophen (TYLENOL) tablet 650 mg: 650 mg | ORAL | @ 05:00:00 | Stop: 2022-09-19

## 2022-09-19 NOTE — Unmapped (Signed)
Diet tolerance/appetite: Denies n/v and tolerated diet well.     Pain or nausea?  PRNs given?  Effective? PRN morphine was given for pain with good relief.    Mobility: Ambulated to bathroom without falls or injuries.    VS stable? VSS during this shift.     Foley or Retrograde Voiding Trial? Retrograde voiding trial    Retrograde Voiding Trial performed at (date/time): 09/19/2022 @ 0600.   - Amount instilled (mL):   - Amount voided (mL):   - PVR (bladder scan, mL): 0mL   - Result (pass/fail): passed     Problem: Wound  Goal: Optimal Coping  Outcome: Progressing  Goal: Optimal Functional Ability  Outcome: Progressing  Goal: Absence of Infection Signs and Symptoms  Outcome: Progressing  Goal: Improved Oral Intake  Outcome: Progressing  Goal: Optimal Pain Control and Function  Outcome: Progressing  Goal: Skin Health and Integrity  Outcome: Progressing  Intervention: Optimize Skin Protection  Recent Flowsheet Documentation  Taken 09/19/2022 0600 by Hillery Aldo, RN  Pressure Reduction Techniques: frequent weight shift encouraged  Head of Bed (HOB) Positioning: HOB at 30-45 degrees  Taken 09/19/2022 0400 by Hillery Aldo, RN  Pressure Reduction Techniques: frequent weight shift encouraged  Head of Bed (HOB) Positioning: HOB at 30-45 degrees  Taken 09/19/2022 0200 by Hillery Aldo, RN  Pressure Reduction Techniques: frequent weight shift encouraged  Head of Bed (HOB) Positioning: HOB at 30-45 degrees  Taken 09/19/2022 0000 by Hillery Aldo, RN  Pressure Reduction Techniques: frequent weight shift encouraged  Head of Bed (HOB) Positioning: HOB at 30-45 degrees  Goal: Optimal Wound Healing  Outcome: Progressing     Problem: Fall Injury Risk  Goal: Absence of Fall and Fall-Related Injury  Outcome: Progressing  Intervention: Promote Injury-Free Environment  Recent Flowsheet Documentation  Taken 09/19/2022 0600 by Hillery Aldo, RN  Safety Interventions:   fall reduction program maintained   family at bedside   low bed nonskid shoes/slippers when out of bed  Taken 09/19/2022 0400 by Hillery Aldo, RN  Safety Interventions:   fall reduction program maintained   family at bedside   low bed   nonskid shoes/slippers when out of bed  Taken 09/19/2022 0200 by Hillery Aldo, RN  Safety Interventions:   fall reduction program maintained   family at bedside   low bed   nonskid shoes/slippers when out of bed  Taken 09/19/2022 0000 by Hillery Aldo, RN  Safety Interventions:   fall reduction program maintained   family at bedside   low bed   nonskid shoes/slippers when out of bed     Problem: Comorbidity Management  Goal: Blood Pressure in Desired Range  Outcome: Progressing

## 2022-09-19 NOTE — Unmapped (Signed)
Abdominal myomectomy

## 2022-09-19 NOTE — Unmapped (Signed)
Pt POD # 1. SX site C/D/I, no drainage. no pain reported. Adequate UOP, pt voiding, 0 BM. Tolerating regular diet. PIV's removed, cath tip intact. AVS printed and gave to patient. Reviewed, all questions answered. Understanding acknowledged. Pt discharged by transport via wheelchair, went by pharmacy. Fall and safety precautions maintained, plan of care ongoing.      Problem: Wound  Goal: Optimal Coping  Outcome: Resolved  Goal: Optimal Functional Ability  Outcome: Resolved  Intervention: Optimize Functional Ability  Recent Flowsheet Documentation  Taken 09/19/2022 0800 by Manson Passey, Rheta Hemmelgarn M, RN  Activity Management: ambulated in room  Goal: Absence of Infection Signs and Symptoms  Outcome: Resolved  Goal: Improved Oral Intake  Outcome: Resolved  Goal: Optimal Pain Control and Function  Outcome: Resolved  Goal: Skin Health and Integrity  Outcome: Resolved  Intervention: Optimize Skin Protection  Recent Flowsheet Documentation  Taken 09/19/2022 1000 by Manson Passey, Carmita Boom M, RN  Pressure Reduction Techniques: frequent weight shift encouraged  Taken 09/19/2022 0800 by Manson Passey, Chanel Mcadams M, RN  Activity Management: ambulated in room  Pressure Reduction Techniques: frequent weight shift encouraged  Goal: Optimal Wound Healing  Outcome: Resolved     Problem: Adult Inpatient Plan of Care  Goal: Plan of Care Review  Outcome: Resolved  Goal: Patient-Specific Goal (Individualized)  Outcome: Resolved  Goal: Absence of Hospital-Acquired Illness or Injury  Outcome: Resolved  Intervention: Identify and Manage Fall Risk  Recent Flowsheet Documentation  Taken 09/19/2022 1000 by Gretta Began, RN  Safety Interventions: fall reduction program maintained  Taken 09/19/2022 0800 by Manson Passey, Yanira Tolsma M, RN  Safety Interventions: fall reduction program maintained  Intervention: Prevent Skin Injury  Recent Flowsheet Documentation  Taken 09/19/2022 1000 by Gretta Began, RN  Positioning for Skin: Supine/Back  Taken 09/19/2022 0800 by Gretta Began, RN  Positioning for Skin: Supine/Back  Intervention: Prevent and Manage VTE (Venous Thromboembolism) Risk  Recent Flowsheet Documentation  Taken 09/19/2022 1000 by Manson Passey, Ellsie Violette M, RN  Anti-Embolism Device Type: SCD, Knee  Anti-Embolism Intervention: On  Anti-Embolism Device Location: BLE  Taken 09/19/2022 0800 by Manson Passey, Latifa Noble M, RN  Anti-Embolism Device Type: SCD, Knee  Anti-Embolism Intervention: On  Anti-Embolism Device Location: BLE  Goal: Optimal Comfort and Wellbeing  Outcome: Resolved  Goal: Readiness for Transition of Care  Outcome: Resolved  Goal: Rounds/Family Conference  Outcome: Resolved     Problem: Comorbidity Management  Goal: Blood Pressure in Desired Range  Outcome: Resolved     Problem: Fall Injury Risk  Goal: Absence of Fall and Fall-Related Injury  Outcome: Resolved  Intervention: Promote Injury-Free Environment  Recent Flowsheet Documentation  Taken 09/19/2022 1000 by Manson Passey, Harland Aguiniga M, RN  Safety Interventions: fall reduction program maintained  Taken 09/19/2022 0800 by Gretta Began, RN  Safety Interventions: fall reduction program maintained

## 2022-09-19 NOTE — Unmapped (Signed)
Inpatient Minimally Invasive Gyn Surgery Progress Note    Interval Events:   - s/p abodminal myomectomy, 25 fibroids removed  - EBL 2700, 1515 returned via cell saver  - Pre-op Hgb 12.5 > 10.4 intra-op > 11.8 4 hours post-op > Am Hgb returned at 10.2  - Passed VT this AM    Subjective: Pt overall doing well. Pain adequately controlled with oxycodone. Abdominal binder in place. Tolerating a general diet without n/v. Voiding freely without issue and passed VT this AM. Passing flatus Ambulating w/o LH/dizziness. Moderate vaginal bleeding, changed pad 2x since leaving OR. Denies f/c, SOB, CP, or new onset lower extremity swelling. Feels ready for DC to home today.     O:     Vitals:    09/19/22 0800   BP: 118/65   Pulse: 104   Resp: 18   Temp: 36.4 ??C (97.5 ??F)   SpO2: 99%     Gen: lying in bed, NAD  CV: regular rate  Pulm: no inc WOB  Abd: Soft, appropriately tender, 12 cm pfannenstiel incisions c/d/i with suture and glue in place. Abdominal binder in place  Pelvic: scant bleeding on pad, recently changed   Ext: symmetric, without edema/erythema/tenderness  Neuro: appropriate mood/affect      Intake/Output Summary (Last 24 hours) at 09/19/2022 1104  Last data filed at 09/19/2022 0900  Gross per 24 hour   Intake 5216 ml   Output 3575 ml   Net 1641 ml       Labs:  Lab Results   Component Value Date    WBC 13.8 (H) 09/19/2022    RBC 3.54 (L) 09/19/2022    HGB 10.2 (L) 09/19/2022    HCT 31.3 (L) 09/19/2022    MCV 88.4 09/19/2022    MCH 28.9 09/19/2022    MCHC 32.6 09/19/2022    RDW 16.1 (H) 09/19/2022    PLT 178 09/19/2022    MPV 7.0 09/19/2022       Meds:     Current Facility-Administered Medications:     [COMPLETED] bupivacaine LIPOSOMAL (PF) (EXPAREL) 266 mg, sodium chloride (NS) 0.9 % 1 mL infiltration injection, 266 mg, Infiltration, Once, 266 mg at 09/18/22 1740 **AND**   EXPAREL ADMINISTERED WITHIN 96 HRS - NO BUPIVACAINE FOR 96 HOURS AFTER EXPAREL, 1 each, Other, Continuous, D'Ercole, Olena Leatherwood, MD    acetaminophen (TYLENOL) tablet 650 mg, 650 mg, Oral, Q6H SCH, Mapatano, Sweet Hope E, MD, 650 mg at 09/19/22 1610    amitriptyline (ELAVIL) tablet 50 mg, 50 mg, Oral, Nightly, Mapatano, Sweet Hope E, MD, 50 mg at 09/18/22 2059    calcium carbonate (TUMS) chewable tablet 400 mg of elem calcium, 400 mg of elem calcium, Oral, TID PRN, Mapatano, Sweet Hope E, MD    dextrose 5 % in lactated ringers infusion, 100 mL/hr, Intravenous, Continuous, Mapatano, Sweet Hope E, MD, Last Rate: 100 mL/hr at 09/18/22 2111, 100 mL/hr at 09/18/22 2111    diphenhydrAMINE (BENADRYL) capsule/tablet 25 mg, 25 mg, Oral, Q4H PRN, Mapatano, Sweet Hope E, MD    docusate sodium (COLACE) capsule 100 mg, 100 mg, Oral, Daily PRN, Mapatano, Sweet Hope E, MD    ibuprofen (MOTRIN) tablet 600 mg, 600 mg, Oral, Q6H SCH, Mapatano, Sweet Hope E, MD, 600 mg at 09/19/22 0552    lactated Ringers infusion, 10 mL/hr, Intravenous, Continuous, Jesusita Jocelyn B, MD, Last Rate: 10 mL/hr at 09/18/22 1325, New Bag at 09/18/22 1626    morphine injection 2 mg, 2 mg, Intravenous, Q4H PRN, Mapatano,  Sweet Hope E, MD, 2 mg at 09/18/22 2059    ondansetron (ZOFRAN) injection 4 mg, 4 mg, Intravenous, Q6H PRN, Mapatano, Sweet Hope E, MD, 4 mg at 09/18/22 2059    oxyCODONE (ROXICODONE) immediate release tablet 5 mg, 5 mg, Oral, Q4H PRN **OR** oxyCODONE (ROXICODONE) immediate release tablet 10 mg, 10 mg, Oral, Q4H PRN, Mapatano, Sweet Hope E, MD    polyethylene glycol (MIRALAX) packet 17 g, 17 g, Oral, Daily PRN, Mapatano, Sweet Hope E, MD    pravastatin (PRAVACHOL) tablet 40 mg, 40 mg, Oral, Nightly, Mapatano, Sweet Hope E, MD, 40 mg at 09/18/22 2059    Assessment/Plan: Carrie Bush is a 41 y.o. POD#1 s/p abdominal myomectomy. Doing well overnight, recovering appropriately.    1. Gyn:    -- f/u final pathology   -- IUD removed in OR   -- Pt on aygestin 10mg  pre-op for control of bleeding symptoms, plan to continue post-op  2. CV/Heme: HDS by vitals/exam/UOP.   --  EBL 2700, 1515 returned via cell saver  -- Pre-op Hgb 12.5 > 10.4 intra-op > 11.8 4 hours post-op > Am Hgb returned at 10.2   -- no e/o intraabdominal bleeding, no s/sx of anemia  3. Pulm: satting well on RA  4. GI/FEN:    -- HLIV   -- Gen diet   -- Colace BID, Miralax qD   -- Anti-emetics prn  5. GU: VF, no issues.  6. ID: No issues, s/p ppx surgical antibiotics  7. Endo: no issues  8. Neuro: continue scheduled PO tylenol/ibuprofen, oxy prn for breakthrough pain  9. Ppx: IS encouraged, ambulation encouraged, SCDs while in bed   10. Dispo: routine postop care   -- rx given and discharge instructions/precautions reviewed   -- anticipate discharge home today, pt in agreement with plan     Pincus Badder, MD, [facility]  Minimally Invasive Gynecologic Surgery     11:04 AM

## 2022-09-19 NOTE — Unmapped (Signed)
Brief Operative Note  (CSN: [id number])      Date of Surgery: 09/18/2022    Pre-op Diagnosis: uterine fibroids    Post-op Diagnosis: Same    Procedure(s):  MYOMECTOMY, EXC FIBRD TUMOR(S) UTERUS, 5 OR MORE INTRAM MYOMA &/OR INTRAM MYOMAS TOT WT > 250 GMS, ABD APPR: 58146 (CPT??)  Note: Revisions to procedures should be made in chart - see Procedures activity.    Performing Service: Advanced Laparoscopy  Surgeon(s) and Role:     * Kallista Pae, Katha Cabal, MD - Primary     * Mapatano, Sweet Hope E, MD - Resident - Assisting    Assistant: Physician Assistant: Epimenio Foot, PA    Findings: Large fibroid uterus, 25 fibroids removed     Anesthesia: General    Estimated Blood Loss: 2700 mL, 1515 returned via cell saver, 2L crstyalloid 720 mL of albumin     UOP: 250 mL clear urine    Complications: None    Specimens:   ID Type Source Tests Collected by Time Destination   1 : Uterine Fibroids Tissue Uterus SURGICAL PATHOLOGY Russella Dar, Katha Cabal, MD 09/18/2022 1427        Implants:   Implant Name Type Inv. Item Serial No. Manufacturer Lot No. LRB No. Used Action   Barrier Adhes Absorb Interceed - [id number] Mesh Barrier Adhes Absorb Interceed  [employer]ON [id number] Midline 3 Implanted       Surgeon Notes: I was present and scrubbed for the entire procedure    Rito Ehrlich, MD   Date: 09/18/2022  Time: 5:54 PM    Pincus Badder, MD, [facility]  Minimally Invasive Gynecologic Surgery

## 2022-09-19 NOTE — Unmapped (Signed)
Gynecology Physician Discharge Summary    Admit Date: 09/18/2022    Discharge Date and Time: 09/18/2022 4:55 PM     Discharge to: Home    Discharge Service: Gynecology (GYN)    Discharge Attending Physician: Pincus Badder, MD    Discharge Diagnoses:   Principal Problem:    Fibroid uterus     Procedures:   1) Exam under anesthesia  2) Abdominal myomectomy via 12 cm Pfannenstiel incision; 25 fibroids removed  3) Mirena IUD removal     Hospital Course:    Carrie Bush is a 41 y.o. G2P0020 with a history of AUB and large fibroid uterus desiring surgical management. The patient was admitted for routine postoperative care following the above procedure.  She had an uncomplicated post-operative course. She had a starting Hgb 10.4 with an EBL 2700, 1515 returned via cell saver. Her Hgb was trended closely. Pre-op Hgb 12.5 > 10.4 intra-op > 11.8 4 hours post-op > Am Hgb returned at 10.2. Pt denied symptoms of anemia.     Her chronic medical problems included HTN and Asthma and she was continued on her home medications with lisinopril being restart for HTN on POD#1.     On the day of discharge, she was meeting all postoperative goals; ambulating, voiding spontaneously after passing a voiding trial, and tolerating a regular diet.  She was discharged to home on POD#1 and will follow up in 6 weeks with Dr. Elpidio Galea. Plan was made to continue aygestin 10mg  daily for bleeding control and she was given precautions to contact clinic with heavy vaginal bleeding.     Condition at Discharge: stable    Discharge Day Services:  The patient was seen by the primary team, and deemed ready for discharge.  BP 147/99  - Pulse 78  - Temp 36.7 ??C (98.1 ??F) (Temporal)  - Resp 16  - Ht 167.6 cm (5' 6)  - Wt 96.4 kg (212 lb 9.6 oz)  - SpO2 99%  - BMI 34.31 kg/m??     Gen: no apparent distress  CV: regular rate and rhythm  Pulm: normal work of breathing, clear to auscultation bilaterally  Abd: soft, nontender, nondistended  Inc: Pfannenstiel incision clean, dry, intact, covered with surgical glue  Ext: no calf tenderness or edema bilaterally     Discharge Medications:      Your Medication List        ASK your doctor about these medications      amitriptyline 25 MG tablet  Commonly known as: ELAVIL  Take 2 tablets (50 mg total) by mouth nightly.     ascorbic acid (vitamin C) 250 mg Chew  Commonly known as: ascorbic acid  Chew 1 tablet (250 mg) by mouth in the morning.     cetirizine 10 MG tablet  Commonly known as: ZYRTEC  Take 3-4 tablets (30 - 40 mg total) by mouth twice daily for chronic hives.     ciclopirox 8 % solution  Commonly known as: PENLAC  Apply topically nightly.     clotrimazole-betamethasone 1-0.05 % cream  Commonly known as: LOTRISONE  Apply to affected area 2 times daily     cyclobenzaprine 10 MG tablet  Commonly known as: FLEXERIL  Take 1 tablet (10 mg total) by mouth Three (3) times a day as needed for muscle spasms.     EPINEPHrine 0.3 mg/0.3 mL injection  Commonly known as: AUVI-Q  Inject the contents of 1 syringe (0.3 mg total) into the muscle once for 1  dose as needed.     ferrous sulfate 325 (65 FE) MG tablet  Commonly known as: FeroSuL  Take 1 tablet (325 mg total) by mouth daily.     fluticasone propionate 50 mcg/actuation nasal spray  Commonly known as: FLONASE  Use 2 sprays into each nostril Two (2) times a day.     furosemide 40 MG tablet  Commonly known as: LASIX  Take 1 tablet (40 mg total) by mouth daily as needed.     hydroxychloroquine 200 mg tablet  Commonly known as: PLAQUENIL  Take 1 tablet (200 mg total) by mouth Two (2) times a day.     hydrOXYzine 10 MG tablet  Commonly known as: ATARAX  Take 1 tablet (10 mg total) by mouth every eight (8) hours as needed for itching.     liraglutide 0.6 mg/0.1 mL (18 mg/3 mL) injection  Commonly known as: VICTOZA  Inject 0.1 mL (0.6 mg total) under the skin daily.     lisinopriL 10 MG tablet  Commonly known as: PRINIVIL,ZESTRIL  Take 1 tablet (10 mg total) by mouth daily. For high blood pressure     norethindrone 5 mg tablet  Commonly known as: AYGESTIN  Take 2 tablets (10 mg total) by mouth daily.     simvastatin 40 MG tablet  Commonly known as: ZOCOR  Take 1 tablet (40 mg total) by mouth every evening.     XOLAIR 150 mg/mL syringe  Generic drug: omalizumab  Inject the contents of 2 syringes (300 mg total) under the skin every twenty-eight (28) days.              Recent Test Results:   Lab Results   Component Value Date    WBC 14.5 (H) 09/18/2022    HGB 10.4 (L) 09/18/2022    HCT 32.0 (L) 09/18/2022    PLT 246 09/18/2022       Lab Results   Component Value Date    NA 141 12/09/2021    K 4.1 12/09/2021    CL 105 12/09/2021    CO2 29.9 12/09/2021    BUN 11 12/09/2021    CREATININE 0.84 12/09/2021    GLU 84 12/09/2021    CALCIUM 9.6 12/09/2021    MG 2.1 05/02/2019       Lab Results   Component Value Date    BILITOT 0.4 04/17/2022    BILIDIR <0.10 04/17/2022    PROT 6.3 04/17/2022    ALBUMIN 3.2 (L) 04/17/2022    ALT 14 04/17/2022    AST 17 04/17/2022    ALKPHOS 80 04/17/2022       Pending Test Results:   Pending Labs       Order Current Status    Surgical pathology exam Collected (09/18/22 1427)    ABO/RH In process    PT-INR In process            Discharge Instructions:         Appointments which have been scheduled for you      Nov 02, 2022 11:00 AM  (Arrive by 10:45 AM)  POST OP with Rito Ehrlich, MD  [facility] INVASIVE GYNECOLOGIC SURGERY [facility] REGION) [address]  [address]  [place] [address]  [phone]        Dec 18, 2022  1:10 PM  (Arrive by 12:55 PM)  RETURN  ENDOCRINE with Lindalou Hose Dulcy Fanny, MD  [facility] DIABETES AND ENDOCRINOLOGY [place] [facility]  REGION) [address]  [facility] [address]  [place] [address]  [phone]               The patient's hospitalization has been complicated by the following clinically significant conditions requiring additional evaluation and treatment or having a significant effect of this patient's care: - None    Body mass index is 34.31 kg/m??.            Code Status:  Full Code

## 2022-09-19 NOTE — Unmapped (Signed)
Pre-Op Diagnoses:   1) 41 y.o. [id number] with fibroid uterus and associated bulk and bleeding symptoms    Post-Op Diagnoses:   2) Same    Anesthesia: General endotracheal anesthesia    Attending Surgeon: Pincus Badder, MD    Assistants:   1) Sweet Mancel Parsons, MD, R1  2) Epimenio Foot, PA-C    Operation:   1) Exam under anesthesia  2) Abdominal myomectomy via 12 cm Pfannenstiel incision; 25 fibroids removed  3) Mirena IUD removal     COMPLICATIONS: None     ESTIMATED BLOOD LOSS:  2700 mL, 1515 returned via cell saver    FLUIDS: 2L crstyalloid 720 mL of albumin     UOP: 250 mL of clear urine     SPECIMENS:   1) Uterine fibroids- 25 fibroids removed    FINDINGS: On exam under anesthesia, a multi-fibroid 20 cm uterus extending to the level of the umbilicus was palpated. week size uterus was palpated with no adnexal masses. Intraoperatively, a multifibroid 20cm week sized uterus was visualized with normal right and left ovaries and Fallopian tubes. Pedunculated fibroid noted to be extending from L cornua along L fallopian tube.     There were 25 fibroids removed from a transfundual hysterotomy and a posterior hysterotomy.    Due to the number and depth of the hysterotomies required, we would not recommend a trial of labor. We would recommend a scheduled cesarean delivery in a future pregnancy    INDICATIONS: Carrie Bush  is a 41 y.o. G2P0020 with large symptomatic fibroid uterus. She was counseled about the management options and opted to proceed with surgical intervention as above. All the usual risks, benefits, indications, and alternatives to the procedure were discussed and informed consent was obtained.    PROCEDURE: The patient was brought to the operating room and a verification process confirmed the correct patient and procedure and  general endotracheal anesthesia was obtained without difficulty. Exam under anesthesia revealed the findings noted above. The legs were placed in low lithotomy position in [place] stirrups, taking care not to hyperflex or hyperextend the hips or the knees.  A time out was performed.    She was prepped and draped in the normal sterile fashion and a foley catheter was placed in the urinary bladder. A uterine manipulator was placed in the usual fashion. IUD strings were noted to be extending 3cm from the external os. 400 mcg of misoprostol was placed rectally and 1 g tranexamic acid was given IV.    The abdomen was entered in the usual fashion: local anesthesia was injected under the skin and then a 12 cm Pfannenstiel incision was made 2 cm above the pubic symphysis. The skin was incised with a scalpel and the incision was taken down to the level of the fascia with monopolar energy. The fascia was incised and the incision was carried laterally with monopolar energy on cut current. The fascia was tented up and dissected off the underlying rectus muscles. The rectus muscles were separated in the midline and the peritoneum was tented up and entered sharply. The peritoneal incision was carried vertically with monopolar energy. A self-retaining retractor was placed.     The uterus was palpated and hysterotomy sites were planned. Decision was made for a transfundal vertical incision to maximize fibroid removal from a single hysterotomy. Each fibroid was removed in a similar fashion: dilute vasopressin (20 units in 200 mL) was injected subserosally over the fibroid. An incision was made until the fibroid  capsule was reached. The fibroid was grasped with the tenaculum and shelled out from the surrounding myometrium using blunt dissection and judicious use of monopolar energy. The defect was closed in multiple layers, the last incorporating the serosa. The endometrial cavity was breached and the overlying myometrium was reapproximated. She was noted to have fibroids abutting the endometrium which were removed.  She was also noted to have a pedunculated fibroid at the L cornua just above the L fallopian tube- this was carefully excised with caution to not disrupt or distort the tube. A single 1cm fibroid in the mesosalpinx was left in place due to proximity to the vasculature to the fallopian tube.     Once all 25 fibroids were removed, the abdomen was copiously irrigated and excellent hemostasis was observed. Floseal was placed on the hysterotomy. Interceed was placed along the hysterotomy sites to aid in prevention of post-operative adhesions. All instruments were removed from the abdomen.     The peritoneum was closed with 3-0 Vicryl in a running fashion. The rectus muscles were inspected and excellent hemostasis was observed. The fascia was then closed with 0-Vicryl in a running fashion started from each apex. The subcutaneous adipose tissue was closed with 3-0 Vicryl. The skin was then reapproximately with 4-0 monocryl in a subcuticular fashion. Dermabond was then placed over the skin.    All sponge, lap, and needle counts were correct. On speculum exam at case completion the IUD strings were noted to be lengthened consistent with cervical displacement of the IUD, the string were grasped and the IUD was removed. The IUD was noted to be intact.     The patient was extubated without complication.  Patient tolerated the procedure well and was taken recovery room in stable condition.     I was present for the entirety of the case.    Pincus Badder, MD, [facility]  Minimally Invasive Gynecologic Surgery

## 2022-09-21 MED ORDER — EMPTY CONTAINER
2 refills | 0 days
Start: 2022-09-21 — End: ?

## 2022-09-21 NOTE — Unmapped (Signed)
[facility]   Patient Onboarding/Medication Counseling    Ms.Carrie Bush is a 41 y.o. female with chronic idiopathic urticaria who I am counseling today on continuation of therapy.  I am speaking to the patient.    Was a Nurse, learning disability used for this call? No    Verified patient's date of birth / HIPAA.    Specialty medication(s) to be sent: Inflammatory Disorders: Xolair      Non-specialty medications/supplies to be sent: lisinopril 10mg , lydroxychloroquine 200, furosemide 40mg , ferosul 325 mg, norethindrone 5mg , flonase nasal spray      Medications not needed at this time: hydroxyzine        The patient declined counseling on medication administration, missed dose instructions, goals of therapy, side effects and monitoring parameters, warnings and precautions, drug/food interactions, and storage, handling precautions, and disposal because they have taken the medication previously. The information in the declined sections below are for informational purposes only and was not discussed with patient.       Xolair Syringes (omalizumab)    Medication & Administration     Dosage: Inject 300 mg under the skin every 28 days    Administration:     Vials: Must be administered as a subcutaneous injection in the abdomen or thighs by a healthcare professional. Patient will be observed after receiving first 3 injections in clinic before moving to home administration.    Prefilled Syringe:  Home administration screening questions:    Has patient ever had an anaphylaxis reaction to Xolair or other agents, such as foods, drugs, biologics, etc? No    Has patient received at least 3 doses in clinic under the supervision of a healthcare proefssional? Yes    Does the patient have an Epi-pen to use for possible anaphylaxis reaction? Yes    Injection administration:   Gather all supplies needed for injection on a clean, flat working surface: medication syringe(s) removed from packaging, alcohol swab, sharps container, etc.  Look at the medication label - look for correct medication, correct dose, and check the expiration date  Look at the medication - the liquid in the syringe should appear clear and colorless to slightly yellow, you may see a few white particles  Lay the syringe on a flat surface and allow it to warm up to room temperature for at least 15-30 minutes  Select injection site - you can use the front of your thigh or your belly (but not the area 2 inches around your belly button); if someone else is giving you the injection you can also use your upper arm in the skin covering your triceps muscle or in the buttocks  Prepare injection site - wash your hands and clean the skin at the injection site with an alcohol swab and let it air dry, do not touch the injection site again before the injection  Pull off the needle safety cap, do not remove until immediately prior to injection  Pinch the skin - with your hand not holding the syringe pinch up a fold of skin at the injection site using your forefinger and thumb  Insert the needle into the fold of skin at about a 45 degree angle - it's best to use a quick dart-like motion  Push the plunger down slowly as far as it will go until the syringe is empty, if the plunger is not fully depressed the needle shield will not extend to cover the needle when it is removed, hold the syringe in place for a full  5 seconds  Check that the syringe is empty and keep pressing down on the plunger while you pull the needle out at the same angle as inserted; after the needle is removed completely from the skin, release the plunger allowing the needle shield to activate and cover the used needle  Dispose of the used syringe immediately in your sharps disposal container, do not attempt to recap the needle prior to disposing  If you see any blood at the injection site, press a cotton ball or gauze on the site and maintain pressure until the bleeding stops, do not rub the injection site    Adherence/Missed dose instructions: If you miss a dose take as soon as you remember.  Resume the correct dosing schedule.        Goals of Therapy     To treat asthma and or chronic idiopathic urticaria    Side Effects & Monitoring Parameters     Commonly reported side effects  Headache  Nausea, vomiting   Injection site reaction  Loss of strength and energy  Common cold symptoms, sore throat, stuffy nose  Ear pain  Painful extremities     The following side effects should be reported to the provider:  Signs of cerebrovascular disease (change in strength on one side is greater than the other, trouble speaking or thinking, change in balance or vision changes)  Signs of DVT (swelling, warmth, numbness, change in color or pain in extremities)  Signs of anaphylaxis (wheezing, chest tightness, swelling of face, lips, tongue or throat)    Monitoring Parameters:   Anaphylactic/hypersensitivity reactions (observe patients for 2 hours after the first 3 injections and 30 minutes after subsequent injections or in accordance with individual institution policies and procedures);   Baseline serum total IgE; FEV1, peak flow, and/or other pulmonary function tests  Monitor for signs of infection    Contraindications, Warnings, & Precautions   [place] Boxed Warning]: Anaphylaxis, including delayed-onset anaphylaxis, has been reported following administration; anaphylaxis may present as bronchospasm, hypotension, syncope, urticaria, and/or angioedema of the throat or tongue. Anaphylaxis has occurred after the first dose and in some cases >1 year after initiation of regular treatment. Due to the risk, patients should be observed closely for an appropriate time period after administration and should receive treatment only under direct medical supervision. Healthcare providers should be prepared to administer appropriate therapy for managing potentially life-threatening anaphylaxis. Patients should be instructed on identifying signs/symptoms of anaphylaxis and to seek immediate care if they arise.      Contraindications  Severe hypersensitivity reaction to omalizumab or any component of the formulation    Warnings & Precautions  Cardiovascular effects: Cerebrovascular events, including transient ischemic attack and ischemic stroke, have been reported.  Eosinophilia and vasculitis: In rare cases, patients may present with systemic eosinophilia, sometimes presenting with clinical features of vasculitis.    Fever/arthralgia/rash: Reports of a constellation of symptoms including fever, arthritis or arthralgia, rash, and lymphadenopathy have been reported with post-marketing use.  Malignant neoplasms: Have been reported rarely with use in short-term studies; impact of long-term use is not known.  Parasitic infections: Use with caution and monitor patients at high risk for parasitic infections; risk of infection may be increased; appropriate duration of continued monitoring following therapy discontinuation has not been established.  Corticosteroid therapy: Gradually taper systemic or inhaled corticosteroid therapy; do not discontinue corticosteroids abruptly following initiation of omalizumab therapy. The combined use of omalizumab and corticosteroids in patients with chronic idiopathic urticaria has not been evaluated.  Latex: Prefilled syringe: The needle cap may contain natural rubber latex.  Appropriate use: Therapy has not been shown to alleviate acute asthma exacerbations; do not use to treat acute bronchospasm, status asthmaticus, or other allergic conditions. Do not use to treat forms of urticaria other than chronic idiopathic urticaria.  Dosing/IgE levels: Dosing for asthma is based on body weight and pretreatment total IgE serum levels. IgE levels remain elevated up to 1 year following treatment; therefore, levels taken during treatment or for up to 1 year following treatment cannot and should not be used as a dosage guide. Dosing in chronic idiopathic urticaria is not dependent on serum IgE (free or total) level or body weight.  Pregnancy Considerations: Omalizumab is a humanized monoclonal antibody (IgG1). Potential placental transfer of human IgG is dependent upon the IgG subclass and gestational age, generally increasing as pregnancy progresses.  Breastfeeding Considerations: It is not known if omalizumab is present in breast milk; however, IgG is excreted in human milk.  Based on information from the pregnancy exposure registry, an increased risk of adverse events was not observed in breastfed infants of mothers using omalizumab. According to the manufacturer, the decision to breastfeed during therapy should consider the risk of infant exposure, the benefits of breastfeeding to the infant, and benefits of treatment to the mother      Drug/Food Interactions     Medication list reviewed in Epic. The patient was instructed to inform the care team before taking any new medications or supplements. No drug interactions identified.     Storage, Handling Precautions, & Disposal     Store this medication in the refrigerator, 2??C to 8??C (36??F to 46??F). in the original carton.  Protect from direct sunlight and do not freeze. Must be used within 4 hours after removal from refrigerator.  Do not shake.  Dispose of used syringes in a sharps disposal container.      Current Medications (including OTC/herbals), Comorbidities and Allergies     Current Outpatient Medications   Medication Sig Dispense Refill    acetaminophen (TYLENOL) 325 MG tablet Take 2 tablets (650 mg total) by mouth every six (6) hours. 30 tablet 0    amitriptyline (ELAVIL) 25 MG tablet Take 2 tablets (50 mg total) by mouth nightly. 90 tablet 6    ascorbic acid, vitamin C, (ASCORBIC ACID) 250 mg Chew Chew 1 tablet (250 mg) by mouth in the morning. 90 tablet 5    cetirizine (ZYRTEC) 10 MG tablet Take 3-4 tablets (30 - 40 mg total) by mouth twice daily for chronic hives. 240 tablet 11 ciclopirox (PENLAC) 8 % solution Apply topically nightly. 6.6 mL 0    clotrimazole-betamethasone (LOTRISONE) 1-0.05 % cream Apply to affected area 2 times daily 15 g 1    cyclobenzaprine (FLEXERIL) 10 MG tablet Take 1 tablet (10 mg total) by mouth Three (3) times a day as needed for muscle spasms. 30 tablet 0    docusate sodium (COLACE) 100 MG capsule Take 1 capsule (100 mg total) by mouth daily as needed for constipation. 30 capsule 0    EPINEPHrine (AUVI-Q) 0.3 mg/0.3 mL injection Inject the contents of 1 syringe (0.3 mg total) into the muscle once for 1 dose as needed. 2 each 4    ferrous sulfate (FEROSUL) 325 (65 FE) MG tablet Take 1 tablet (325 mg total) by mouth daily. 30 tablet 4    fluticasone propionate (FLONASE) 50 mcg/actuation nasal spray Use 2 sprays into each nostril Two (2) times a day. 16  g 2    furosemide (LASIX) 40 MG tablet Take 1 tablet (40 mg total) by mouth daily as needed. 30 tablet 5    hydroxychloroquine (PLAQUENIL) 200 mg tablet Take 1 tablet (200 mg total) by mouth Two (2) times a day. 180 tablet 3    hydrOXYzine (ATARAX) 10 MG tablet Take 1 tablet (10 mg total) by mouth every eight (8) hours as needed for itching. 60 tablet 2    ibuprofen (MOTRIN) 600 MG tablet Take 1 tablet (600 mg total) by mouth Three (3) times a day. 60 tablet 0    liraglutide (VICTOZA) injection pen Inject 0.1 mL (0.6 mg total) under the skin daily. 18 mL 1    lisinopriL (PRINIVIL,ZESTRIL) 10 MG tablet Take 1 tablet (10 mg total) by mouth daily. For high blood pressure 30 tablet 11    norethindrone (AYGESTIN) 5 mg tablet Take 2 tablets (10 mg total) by mouth daily. 30 tablet 11    omalizumab (XOLAIR) 150 mg/mL syringe Inject the contents of 2 syringes (300 mg total) under the skin every twenty-eight (28) days. 2 mL 11    oxyCODONE (ROXICODONE) 5 MG immediate release tablet Take 1 tablet (5 mg total) by mouth every four (4) hours as needed for pain for up to 5 days. 15 tablet 0    polyethylene glycol (MIRALAX) 17 gram packet Mix 1 packet (17 g) in 4 to 8 ounces of water, tea, juice, or coffee and drink by mouth daily as needed. 20 packet 0    simvastatin (ZOCOR) 40 MG tablet Take 1 tablet (40 mg total) by mouth every evening. 30 tablet 4     Current Facility-Administered Medications   Medication Dose Route Frequency Provider Last Rate Last Admin    omalizumab Geoffry Paradise) injection 300 mg  300 mg Subcutaneous Q28 Days Micki Riley M, RN   300 mg at 06/11/22 1106       Allergies   Allergen Reactions    Montelukast Other (See Comments)     Felt like her throat was tightening up ML,CMA     Permethrin Hives, Itching and Rash    Atorvastatin      Headache     Rosuvastatin      Myalgias      Sulfa (Sulfonamide Antibiotics) Rash       Patient Active Problem List   Diagnosis    Gasping for breath    Athletes foot    Chronic idiopathic urticaria    Coughing    Fluid retention    Generalized bloating    Nail fungal infection    Weight gain, abnormal    Ear pressure    Sensation of skin crawling    Nasal and sinus discharge    Skin irritation    Allergic rhinitis    Dysfunction of eustachian tube    History of ischemic stroke    Aphasia due to recent stroke    Carotid artery, internal, occlusion, left    Post-traumatic headache    Essential hypertension    Iron deficiency anemia secondary to inadequate dietary iron intake    Disc disease, degenerative, cervical    Fibroid uterus       Reviewed and up to date in Epic.    Appropriateness of Therapy     Acute infections noted within Epic:  No active infections  Patient reported infection: None    Is medication and dose appropriate based on diagnosis and infection status? Yes    Prescription has been clinically  reviewed: Yes      Baseline Quality of Life Assessment      How many days over the past month did your idiopathic urticaria  keep you from your normal activities? For example, brushing your teeth or getting up in the morning. 0    Financial Information     Medication Assistance provided: Prior Authorization    Anticipated copay of $4 / 28 days reviewed with patient. Verified delivery address.    Delivery Information     Scheduled delivery date: 09/23/22    Expected start date: Continuation of therapy - next dose due now. Ms. Popko will take dose once she receives it on 12/12.    Medication will be delivered via UPS to the prescription address in [facility]  This shipment will not require a signature.      Explained the services we provide at [facility] and that each month we would call to set up refills.  Stressed importance of returning phone calls so that we could ensure they receive their medications in time each month.  Informed patient that we should be setting up refills 7-10 days prior to when they will run out of medication.  A pharmacist will reach out to perform a clinical assessment periodically.  Informed patient that a welcome packet, containing information about our pharmacy and other support services, a Notice of Privacy Practices, and a drug information handout will be sent.      The patient or caregiver noted above participated in the development of this care plan and knows that they can request review of or adjustments to the care plan at any time.      Patient or caregiver verbalized understanding of the above information as well as how to contact the pharmacy at [phone] option 4 with any questions/concerns.  The pharmacy is open Monday through Friday 8:30am-4:30pm.  A pharmacist is available 24/7 via pager to answer any clinical questions they may have.    Patient Specific Needs     Does the patient have any physical, cognitive, or cultural barriers? No    Does the patient have adequate living arrangements? (i.e. the ability to store and take their medication appropriately) Yes    Did you identify any home environmental safety or security hazards? No    Patient prefers to have medications discussed with  Patient     Is the patient or caregiver able to read and understand education materials at a high school level or above? Yes    Patient's primary language is  English     Is the patient high risk? No    SOCIAL DETERMINANTS OF HEALTH     At the [facility], we have learned that life circumstances - like trouble affording food, housing, utilities, or transportation can affect the health of many of our patients.   That is why we wanted to ask: are you currently experiencing any life circumstances that are negatively impacting your health and/or quality of life? Patient declined to answer    Social Determinants of Health     Financial Resource Strain: High Risk (01/22/2022)    Overall Financial Resource Strain (CARDIA)     Difficulty of Paying Living Expenses: Very hard   Internet Connectivity: Not on file   Food Insecurity: Food Insecurity Present (01/22/2022)    Hunger Vital Sign     Worried About Running Out of Food in the Last Year: Sometimes true     Ran Out of Food  in the Last Year: Sometimes true   Tobacco Use: Medium Risk (09/18/2022)    Patient History     Smoking Tobacco Use: Former     Smokeless Tobacco Use: Never     Passive Exposure: Past   Housing/Utilities: High Risk (01/22/2022)    Housing/Utilities     Within the past 12 months, have you ever stayed: outside, in a car, in a tent, in an overnight shelter, or temporarily in someone else's home (i.e. couch-surfing)?: Yes     Are you worried about losing your housing?: Yes     Within the past 12 months, have you been unable to get utilities (heat, electricity) when it was really needed?: Yes   Alcohol Use: Not on file   Transportation Needs: No Transportation Needs (01/22/2022)    PRAPARE - Transportation     Lack of Transportation (Medical): No     Lack of Transportation (Non-Medical): No   Substance Use: Not on file   Health Literacy: Low Risk  (01/16/2021)    Health Literacy     : Never   Physical Activity: Not on file   Interpersonal Safety: Not on file   Stress: Not on file   Intimate Partner Violence: Not At Risk (05/02/2019)    Humiliation, Afraid, Rape, and Kick questionnaire     Fear of Current or Ex-Partner: No     Emotionally Abused: No     Physically Abused: No     Sexually Abused: No   Depression: Not at risk (01/22/2022)    PHQ-2     PHQ-2 Score: 1   Social Connections: Unknown (05/02/2019)    Social Connection and Isolation Panel [NHANES]     Frequency of Communication with Friends and Family: Not on file     Frequency of Social Gatherings with Friends and Family: Not on file     Attends Religious Services: Not on file     Active Member of Clubs or Organizations: Not on file     Attends Banker Meetings: Not on file     Marital Status: Separated       Would you be willing to receive help with any of the needs that you have identified today? Not applicable       [name], PharmD  [facility] Specialty Pharmacist

## 2022-09-22 MED FILL — EMPTY CONTAINER: 120 days supply | Qty: 1 | Fill #0

## 2022-09-22 MED FILL — HYDROXYCHLOROQUINE 200 MG TABLET: ORAL | 90 days supply | Qty: 180 | Fill #4

## 2022-09-22 MED FILL — FUROSEMIDE 40 MG TABLET: ORAL | 90 days supply | Qty: 90 | Fill #1

## 2022-09-22 MED FILL — FLUTICASONE PROPIONATE 50 MCG/ACTUATION NASAL SPRAY,SUSPENSION: NASAL | 15 days supply | Qty: 16 | Fill #2

## 2022-09-22 MED FILL — NORETHINDRONE ACETATE 5 MG TABLET: ORAL | 15 days supply | Qty: 30 | Fill #7

## 2022-09-22 MED FILL — LISINOPRIL 10 MG TABLET: ORAL | 90 days supply | Qty: 90 | Fill #3

## 2022-09-28 ENCOUNTER — Ambulatory Visit
Admit: 2022-09-28 | Discharge: 2022-09-29 | Payer: BLUE CROSS/BLUE SHIELD | Attending: Student in an Organized Health Care Education/Training Program | Primary: Student in an Organized Health Care Education/Training Program

## 2022-09-28 DIAGNOSIS — Z86018 Personal history of other benign neoplasm: Principal | ICD-10-CM

## 2022-09-28 DIAGNOSIS — Z9889 Other specified postprocedural states: Principal | ICD-10-CM

## 2022-09-28 DIAGNOSIS — Z113 Encounter for screening for infections with a predominantly sexual mode of transmission: Principal | ICD-10-CM
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
## 2022-09-28 NOTE — Unmapped (Unsigned)
Patient states she was feeling good until she had intercourse on Friday and then she felt pain again on Saturday and also had some bleeding.  The pain is in her abdomen and still present today. It feels like her stomach is hard. She was bleeding heavy on Saturday and then the bleeding stopped that next morning. She states she also fell on Saturday. She states she slid down some steps. No fevers,no n/v.     She also is concerned that there are hard places under the incision.  There is tenderness around these two incisions no drainage. There is no drainage and edges together from what she can tell.     She was advised to go to the Er. She states she will present to the [facility] ER today for evaluation and Dr Elpidio Galea was notified.

## 2022-09-28 NOTE — Unmapped (Signed)
Thank you for receiving care at [facility] ([facility]) ! If you have any questions or concerns you have two ways to contact our team:     1) Nurses line: [phone]  2) MyChart messages    Mychart messages may take up to 7 business days for a response. All mychart messages are received by our nursing team. Our nursing team will contact your provider if they are unable to fully answer your question or concern.   If you have had surgery with our team in the past 3 months, please call the office; do not mychart message. Your call will be triaged based on urgency and returned within 24 hours for urgent issues. Your call will be returned in up to 72 hours for non-urgent issues.  For scheduling appointments, please call the scheduling line ([phone]). Our first available appointment is typically 3-5 months out. Please plan accordingly. You may call weekly for cancellations as slots often become available.    Other helpful numbers:  Surgical Scheduler: [phone]  Financial Counselor:[phone]  Fax: [phone]    Phone number for imaging scheduling:   [place]: [phone]  [place]: [phone]

## 2022-09-30 MED ORDER — OXYCODONE 5 MG TABLET
ORAL_TABLET | ORAL | 0 refills | 3 days | Status: CP | PRN
Start: 2022-09-30 — End: 2022-10-05

## 2022-09-30 MED ORDER — ACETAMINOPHEN 325 MG TABLET
ORAL_TABLET | Freq: Four times a day (QID) | ORAL | 0 refills | 4 days | Status: CP
Start: 2022-09-30 — End: ?

## 2022-09-30 NOTE — Unmapped (Signed)
Refills provided for oxycodone and tylenol. Both rx sent to her local pharmacy in [place].

## 2022-09-30 NOTE — Unmapped (Signed)
Surgery 09/18/22  ) Exam under anesthesia  2) Abdominal myomectomy via??12??cm Pfannenstiel incision; 25??fibroids removed  3) Mirena IUD removal??    She would like Oxycodone refill

## 2022-09-30 NOTE — Unmapped (Signed)
Received MyChart message that she is still having a lot of pain and would like a refill of Oxycodone.    Surgery 09/18/22  ) Exam under anesthesia  2) Abdominal myomectomy via 12 cm Pfannenstiel incision; 25 fibroids removed  3) Mirena IUD removal   ??  Called  No answer. Left generic VM that I was calling for more information about MyChart message and will respond there.

## 2022-09-30 NOTE — Unmapped (Signed)
Received refill request for Acetaminophen 325 mg tablets    Last ordered 09/19/22  Take 2 every 6 hours  # 30 with 0 refills    Next scheduled appointment: 11/02/22    From [facility] 09/28/22 with Dr. Elpidio Galea  Uterine fibroids: Pt now 10 days post-op from abdominal myomectomy.    Refill pended for approval

## 2022-10-06 MED FILL — NORETHINDRONE ACETATE 5 MG TABLET: ORAL | 60 days supply | Qty: 120 | Fill #8

## 2022-10-10 LAB — COMPREHENSIVE METABOLIC PANEL
ALBUMIN: 3.3 g/dL — ABNORMAL LOW (ref 3.4–5.0)
ALKALINE PHOSPHATASE: 76 U/L (ref 46–116)
ALT (SGPT): 11 U/L (ref 10–49)
ANION GAP: 11 mmol/L (ref 5–14)
AST (SGOT): 13 U/L (ref <=34)
BILIRUBIN TOTAL: 0.4 mg/dL (ref 0.3–1.2)
BLOOD UREA NITROGEN: 11 mg/dL (ref 9–23)
BUN / CREAT RATIO: 11
CALCIUM: 9.5 mg/dL (ref 8.7–10.4)
CHLORIDE: 110 mmol/L — ABNORMAL HIGH (ref 98–107)
CO2: 24.4 mmol/L (ref 20.0–31.0)
CREATININE: 1.03 mg/dL — ABNORMAL HIGH
EGFR CKD-EPI (2021) FEMALE: 70 mL/min/{1.73_m2} (ref >=60)
GLUCOSE RANDOM: 110 mg/dL (ref 70–179)
POTASSIUM: 3.5 mmol/L (ref 3.4–4.8)
PROTEIN TOTAL: 7.6 g/dL (ref 5.7–8.2)
SODIUM: 145 mmol/L (ref 135–145)

## 2022-10-10 LAB — CBC W/ AUTO DIFF
BASOPHILS ABSOLUTE COUNT: 0.1 10*9/L (ref 0.0–0.1)
BASOPHILS RELATIVE PERCENT: 0.6 %
EOSINOPHILS ABSOLUTE COUNT: 0.1 10*9/L (ref 0.0–0.5)
EOSINOPHILS RELATIVE PERCENT: 0.8 %
HEMATOCRIT: 42.7 % (ref 34.0–44.0)
HEMOGLOBIN: 13.8 g/dL (ref 11.3–14.9)
LYMPHOCYTES ABSOLUTE COUNT: 1.5 10*9/L (ref 1.1–3.6)
LYMPHOCYTES RELATIVE PERCENT: 9.3 %
MEAN CORPUSCULAR HEMOGLOBIN CONC: 32.5 g/dL (ref 32.0–36.0)
MEAN CORPUSCULAR HEMOGLOBIN: 28 pg (ref 25.9–32.4)
MEAN CORPUSCULAR VOLUME: 86.3 fL (ref 77.6–95.7)
MEAN PLATELET VOLUME: 6.8 fL (ref 6.8–10.7)
MONOCYTES ABSOLUTE COUNT: 0.6 10*9/L (ref 0.3–0.8)
MONOCYTES RELATIVE PERCENT: 4 %
NEUTROPHILS ABSOLUTE COUNT: 13.7 10*9/L — ABNORMAL HIGH (ref 1.8–7.8)
NEUTROPHILS RELATIVE PERCENT: 85.3 %
NUCLEATED RED BLOOD CELLS: 0 /100{WBCs} (ref <=4)
PLATELET COUNT: 495 10*9/L — ABNORMAL HIGH (ref 150–450)
RED BLOOD CELL COUNT: 4.94 10*12/L (ref 3.95–5.13)
RED CELL DISTRIBUTION WIDTH: 15.8 % — ABNORMAL HIGH (ref 12.2–15.2)
WBC ADJUSTED: 16.1 10*9/L — ABNORMAL HIGH (ref 3.6–11.2)

## 2022-10-10 LAB — HIGH SENSITIVITY TROPONIN I - SINGLE: HIGH SENSITIVITY TROPONIN I: 20 ng/L (ref <=34)

## 2022-10-10 LAB — LACTATE SEPSIS, VENOUS: LACTATE BLOOD VENOUS: 1.6 mmol/L (ref 0.5–1.8)

## 2022-10-10 LAB — LIPASE: LIPASE: 49 U/L (ref 12–53)

## 2022-10-11 ENCOUNTER — Emergency Department
Admit: 2022-10-11 | Discharge: 2022-10-11 | Disposition: A | Discharge status: Home / Self Care | Payer: BLUE CROSS/BLUE SHIELD | Attending: Student in an Organized Health Care Education/Training Program

## 2022-10-11 ENCOUNTER — Ambulatory Visit
Admit: 2022-10-11 | Discharge: 2022-10-11 | Disposition: A | Discharge status: Home / Self Care | Payer: BLUE CROSS/BLUE SHIELD | Attending: Student in an Organized Health Care Education/Training Program

## 2022-10-11 LAB — URINALYSIS WITH MICROSCOPY WITH CULTURE REFLEX
BILIRUBIN UA: NEGATIVE
GLUCOSE UA: NEGATIVE
KETONES UA: NEGATIVE
LEUKOCYTE ESTERASE UA: NEGATIVE
NITRITE UA: NEGATIVE
PH UA: 7 (ref 5.0–9.0)
PROTEIN UA: 100 — AB
RBC UA: 10 /HPF — ABNORMAL HIGH (ref <=4)
SPECIFIC GRAVITY UA: 1.021 (ref 1.003–1.030)
SQUAMOUS EPITHELIAL: 1 /HPF (ref 0–5)
UROBILINOGEN UA: <2
WBC UA: 6 /HPF — ABNORMAL HIGH (ref 0–5)

## 2022-10-11 LAB — PREGNANCY, URINE: PREGNANCY TEST URINE: NEGATIVE

## 2022-10-11 MED ORDER — ONDANSETRON 4 MG DISINTEGRATING TABLET
ORAL_TABLET | Freq: Three times a day (TID) | ORAL | 0 refills | 5.00000 days | Status: CP | PRN
Start: 2022-10-11 — End: 2022-10-11

## 2022-10-11 MED ADMIN — acetaminophen (TYLENOL) tablet 1,000 mg: 1000 mg | ORAL | @ 06:00:00 | Stop: 2022-10-11

## 2022-10-11 MED ADMIN — ondansetron (ZOFRAN) injection 4 mg: 4 mg | INTRAVENOUS | @ 03:00:00 | Stop: 2022-10-10

## 2022-10-11 MED ADMIN — lactated ringers bolus 1,779 mL: 30 mL/kg | INTRAVENOUS | @ 03:00:00 | Stop: 2022-10-10

## 2022-10-11 NOTE — Unmapped (Signed)
Emergency Department Provider Note      ED Assessment/Plan     Carrie Bush is a pleasant 41 y.o. female with a past medical history of HTN, HLD, anemia, and stroke (2022 with residual left eye vision loss and right sided weakness) presenting to the Emergency Department with 1 day of nbnb emesis, lightheadedness, diaphoresis, intermittent abdominal pain, and non-bloody loose stools in the setting of 4-5 days of a headache, cough, and congestion.    Initial impression and pertinent physical exam:      BP 106/80  - Pulse 96  - Temp 36.8 ??C (98.2 ??F) (Oral)  - Resp 13  - Ht 167.6 cm (5' 6)  - Wt 95.3 kg (210 lb)  - SpO2 96%  - BMI 33.89 kg/m??     On initial evaluation, the patient is uncomfortable appearing but nontoxic and in no acute distress. Vitals are notable for tachycardia to 128 bpm. Otherwise, vitals are within normal limits. Physical exam with mild epigastric and suprapubic tenderness to palpation. Negative Murphy's sign. No CVA tenderness. Mucous membranes are slightly dry.    Diagnostic workup as below.     Orders Placed This Encounter   Procedures    Blood Culture    Blood Culture    Rapid Influenza / RSV / COVID PCR    Urine Culture    ED POCUS Right Upper Quadrant    XR Chest 2 views    CBC w/ Differential    Comprehensive Metabolic Panel    Lipase Level    Urinalysis with Microscopy with Culture Reflex    Pregnancy Qualitative, Urine    Lactate Sepsis, Venous    hsTroponin I (single, no delta)    Misc nursing order (Sepsis Timer)    Cardiac Monitor    Oxygen sat continuous monitoring    Notify Provider    Obtain medical records    Vital signs    RN to notify pharmacy immediately that an antibiotic has been ordered from the Sepsis Order Set (if not available in the [facility] or if not yet verified)    Adult Oxygen therapy    ECG 12 Lead    ECG 12 Lead    Insert peripheral IV       Will reassess as we get results and update below    ED Course as of 10/11/22 0003   Sat Oct 10, 2022   2218 WBC(!): 16.1  Patient historically has leukocytosis.   2253 On reassessment, the patient states she feels much better after rehydration.  She still has fluids running at this time.  Her heart rate is in the high 90s at this time.  I do feel this likely represents a viral infection will reassess after completion of fluid bolus.  Patient signed out to oncoming provider at 2300.         MDM: My initial impression is a viral gastroenteritis.  Right upper quadrant ultrasound reassuring against cholecystitis.  LFTs and bilirubin reassuring against acute cholangitis and choledocholithiasis.  Patient with tachycardia and leukocytosis, however, lactate is normal and patient had improvement in tachycardia after fluid resuscitation.  I do not believe the patient is septic I think she is dehydrated.  ACS less likely given normal troponin.  Pancreatitis less likely given normal lipase.  Overall, patient has an improvement in her symptoms after rehydration.  Patient signed out to oncoming provider pending UA and pregnancy test.  Patient did have recent uterine surgery for myomectomy, however, I do not believe  this represents a surgical site infection as this was over 3 weeks ago and the patient has no fever.  Additionally, she has very mild tenderness overlying the suprapubic region.      Discussion of Management with other Physicians, QHP, or Appropriate Source: Discussion with other professionals: None  Independent Interpretation of Studies: ECG: Sinus tachycardia  External Records Reviewed: 09/28/22 Gynecology Office Visit Note- The patient presented 10-days post-operative uterine fibroid procedure for follow-up with abdominal pain following intercourse. Her exam was reassuring and she was given strict return precautions.     ____________________________________________    The case was discussed with the attending physician, who is in agreement with the above assessment and plan.     History     Chief Complaint   Patient presents with Abdominal Pain       HPI: Carrie Bush is a pleasant 41 y.o. 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 The patient reports 1 day of nbnb emesis, lightheadedness, diaphoresis, intermittent abdominal pain, and non-bloody loose stools. She also reports 4-5 days of a headache, cough, and congestion. She endorses good PO intake, however, reports dark urine today. She reports she has taken Tylenol and Ibuprofen which resolves her headaches. She denies recent viral illness. She denies chance of pregnancy and notes she recently had uterine fibroid surgery 09/18/22. She reports she started having vaginal bleeding about 1 week ago and bleeds through about 1 large pad per day. She denies fever, chills, vaginal discharge, or dysuria.    PMH/PSH: As noted in HPI.    Allergies:   Allergies   Allergen Reactions    Montelukast Other (See Comments)     Felt like her throat was tightening up ML,CMA     Permethrin Hives, Itching and Rash    Atorvastatin      Headache     Rosuvastatin      Myalgias      Sulfa (Sulfonamide Antibiotics) Rash       Social:   Social History     Tobacco Use    Smoking status: Former     Current packs/day: 0.25     Average packs/day: 0.3 packs/day for 5.0 years (1.3 ttl pk-yrs)     Types: Cigarettes     Passive exposure: Past    Smokeless tobacco: Never    Tobacco comments:     Occassional smoker   Vaping Use    Vaping Use: Never used   Substance Use Topics    Alcohol use: Yes     Alcohol/week: 3.0 standard drinks of alcohol     Types: 3 Shots of liquor per week     Comment: weekends, 3 vodka drinks    Drug use: Never       Physical Exam      General: Awake, alert, in no acute distress  HEENT: Head atraumatic. Slightly dry mucous membranes. No scleral icterus or conjunctivitis.    Cardiac: RRR.   Pulmonary: Breathing comfortably w/ no accessory muscle usage.   Abdominal: Soft and non-distended abdomen. Mild epigastric and suprapubic tenderness to palpation. Negative Murphy's sign. No CVA tenderness.  GU: Deferred.  MSK: No lower extremity edema.   Dermatologic: No jaundice or pallor.    Neurologic: Alert. Follows commands. Moving all extremities spontaneously.    Psychiatric: Normal mood and behavior.        Documentation  assistance was provided by Vilinda Blanks, Scribe on October 10, 2022 at 9:10 PM for Elizabeth Palau, MD.    Documentation assistance was provided by the scribe in my presence.  The documentation recorded by the scribe has been reviewed by me and accurately reflects the services I personally performed.    Elizabeth Palau, PGY-2  [facility] Emergency Medicine  Pager: [phone]            Binnie Rail, MD  Resident  10/11/22 [phone]

## 2022-10-11 NOTE — Unmapped (Signed)
Received call from pt pager.     Pt's mother reports pt unable to get out of bed with nausea and vomiting since 12/25. Has had vaginal bleeding following her surgery, not heavy and unsure if this is related to her current illness. Called EMS as pt too weak to get out of bed, was told she appeared dehydrated. Now at [facility] for evaluation.     Discussed that should pt's symptoms appear related to her surgery on 12/8 or GYN in origin will be available to [facility] staff for consultation. Pt's mother expressed understanding and gratitude.    Carney Corners, MD PGY-2  Department of Obstetrics and Gynecology  [facility]

## 2022-10-11 NOTE — Unmapped (Signed)
Received sign out from previous provider.    Patient Summary: Carrie Bush is a 41 y.o. 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 Exam is notable for mild epigastric and suprapubic TTP. Sepsis work-up notable for chronic leukocytosis to 16.1, otherwise reassuring. POCUS negative. Given LR and 4mg  IM Zofran.   Action List:   Reassess after LR.      Updates  ED Course as of 10/12/22 0607   Sun Oct 11, 2022   0010 UA negative for infection.   4540 Reevaluation of the patient, she is resting comfortably in the room.  She has tolerated p.o. without difficulty.  Denies any abdominal pain and states she is feeling better.  She is requesting to go home.  Will discharge the patient with a prescription for Zofran and recommendations to follow-up with the PCP in the next 2 to 3 days.  Strict return precautions were discussed and all questions were answered.  Patient verbalizes understanding of this and agrees with this plan.       Documentation assistance was provided by Dyanne Carrel, Scribe on October 10, 2022 at 11:31 PM for Suzzanne Cloud, MD.       Documentation assistance was provided by the scribe in my presence.  The documentation recorded by the scribe has been reviewed by me and accurately reflects the services I personally performed.      Note has been documented by Dyanne Carrel on 10/11/2022

## 2022-10-11 NOTE — Unmapped (Signed)
Pt presents via EMS with RUQ abdominal pressure/cramping with N/V and HA for the past 3 days. She was recent fibroids surgery.

## 2022-10-16 NOTE — Unmapped (Signed)
Blood Culture  Order: [id number]  Status: Final result    Blood Culture  Order: [id number]  Status: Final result     Final results reviewed. Both blood cultures are negative. No further actions needed.

## 2022-10-26 NOTE — Unmapped (Unsigned)
The [facility] has made a third and final attempt to reach this patient to refill the following medication:XOLAIR 150 mg/mL syringe (omalizumab).      We have left voicemails on the following phone numbers: [phone] .    Dates contacted: 01/02    01/08   01/15  Last scheduled delivery: 09/22/22    The patient may be at risk of non-compliance with this medication. The patient should call the [facility] at [phone]  Option 4, then Option 2 (all other specialty patients) to refill medication.    Jazier Mcglamery' W Miangel Flom   [facility] Specialty Technician

## 2022-10-29 ENCOUNTER — Ambulatory Visit: Admit: 2022-10-29 | Discharge: 2022-10-30 | Payer: BLUE CROSS/BLUE SHIELD

## 2022-10-29 LAB — COMPREHENSIVE METABOLIC PANEL
ALBUMIN: 2.8 g/dL — ABNORMAL LOW (ref 3.4–5.0)
ALKALINE PHOSPHATASE: 75 U/L (ref 46–116)
ALT (SGPT): 11 U/L (ref 10–49)
ANION GAP: 4 mmol/L — ABNORMAL LOW (ref 5–14)
AST (SGOT): 14 U/L (ref <=34)
BILIRUBIN TOTAL: 0.2 mg/dL — ABNORMAL LOW (ref 0.3–1.2)
BLOOD UREA NITROGEN: 8 mg/dL — ABNORMAL LOW (ref 9–23)
BUN / CREAT RATIO: 12
CALCIUM: 9.2 mg/dL (ref 8.7–10.4)
CHLORIDE: 113 mmol/L — ABNORMAL HIGH (ref 98–107)
CO2: 25 mmol/L (ref 20.0–31.0)
CREATININE: 0.67 mg/dL
EGFR CKD-EPI (2021) FEMALE: >90 mL/min/{1.73_m2} (ref >=60)
GLUCOSE RANDOM: 89 mg/dL (ref 70–179)
POTASSIUM: 4.1 mmol/L (ref 3.4–4.8)
PROTEIN TOTAL: 7 g/dL (ref 5.7–8.2)
SODIUM: 142 mmol/L (ref 135–145)

## 2022-10-29 LAB — CBC W/ AUTO DIFF
BASOPHILS ABSOLUTE COUNT: 0 10*9/L (ref 0.0–0.1)
BASOPHILS RELATIVE PERCENT: 0.5 %
EOSINOPHILS ABSOLUTE COUNT: 0.1 10*9/L (ref 0.0–0.5)
EOSINOPHILS RELATIVE PERCENT: 2.3 %
HEMATOCRIT: 31 % — ABNORMAL LOW (ref 34.0–44.0)
HEMOGLOBIN: 10.2 g/dL — ABNORMAL LOW (ref 11.3–14.9)
LYMPHOCYTES ABSOLUTE COUNT: 1.7 10*9/L (ref 1.1–3.6)
LYMPHOCYTES RELATIVE PERCENT: 29.5 %
MEAN CORPUSCULAR HEMOGLOBIN CONC: 33 g/dL (ref 32.0–36.0)
MEAN CORPUSCULAR HEMOGLOBIN: 28.1 pg (ref 25.9–32.4)
MEAN CORPUSCULAR VOLUME: 85.2 fL (ref 77.6–95.7)
MEAN PLATELET VOLUME: 7.1 fL (ref 6.8–10.7)
MONOCYTES ABSOLUTE COUNT: 0.8 10*9/L (ref 0.3–0.8)
MONOCYTES RELATIVE PERCENT: 12.9 %
NEUTROPHILS ABSOLUTE COUNT: 3.2 10*9/L (ref 1.8–7.8)
NEUTROPHILS RELATIVE PERCENT: 54.8 %
PLATELET COUNT: 436 10*9/L (ref 150–450)
RED BLOOD CELL COUNT: 3.64 10*12/L — ABNORMAL LOW (ref 3.95–5.13)
RED CELL DISTRIBUTION WIDTH: 16.2 % — ABNORMAL HIGH (ref 12.2–15.2)
WBC ADJUSTED: 5.9 10*9/L (ref 3.6–11.2)

## 2022-10-29 LAB — LIPASE: LIPASE: 48 U/L (ref 12–53)

## 2022-10-29 NOTE — Unmapped (Signed)
Problem List Items Addressed This Visit    None  Visit Diagnoses       Chest pain, unspecified type    -  Primary    Relevant Orders    XR Chest 2 views    Finger pain, left        Relevant Orders    Ambulatory referral to Family Practice    Left upper quadrant pain        Relevant Orders    Comprehensive Metabolic Panel    Lipase    CBC w/ Differential          Reviewed warning signs and symptoms. Will follow up per results.     Chief Complaint   Patient presents with    Chest Wall Pain     C/o chest pain with deep breaths. Fall on Sat. Some sob while laying down. Pain helped with otc medication and some leftover pain meds from surgery.     Finger Pain     L ring finger x 1 month.        HPI: had fibroid surgery and feeling much better. Fell on Saturday night. Walking down steps outside, ground was uneven and fell on knees. On Sunday noticed a sharp pain shooting down side. Constant without ibuprofen. When inhales, can not take a full breath because it hurts. Little cough. Nonproductive. Sharp pain. Dull pain when laying in bed. Will become sharp when takes a deep breath. Having a little shortness of breath. Worse after eating. Seen in ED for Left upper quadrant pain in December.     Jammed fourth finger on left hand. Sore. Painful when it is is cold. Jammed in December. Was pushing something into the car and then might pushed hard enough to jam finger.   Review of Systems   Constitutional:  Negative for activity change, appetite change, chills, diaphoresis, fatigue and fever.   Respiratory:  Positive for cough.    Cardiovascular:  Negative for palpitations and leg swelling.   Gastrointestinal:  Negative for diarrhea, nausea and vomiting.      The following portions of the patient's history were reviewed and updated as appropriate: allergies, current medications, past family history, past medical history, past social history, past surgical history, and problem list.  Observations:  Vitals:    10/29/22 1403   BP: 129/88   Pulse: 81   SpO2: 99%       Physical Exam  Vitals and nursing note reviewed.   Constitutional:       Appearance: Normal appearance.   Cardiovascular:      Rate and Rhythm: Normal rate and regular rhythm.      Heart sounds: Normal heart sounds. No murmur heard.     No friction rub. No gallop.   Pulmonary:      Effort: Pulmonary effort is normal. No respiratory distress.      Breath sounds: Normal breath sounds. No wheezing or rales.   Abdominal:      General: Bowel sounds are normal. There is no distension.      Palpations: Abdomen is soft.      Tenderness: There is no abdominal tenderness. There is no guarding or rebound.      Comments: Notes would not be able to tolerate exam if not taking ibuprofen and tylenol.    Neurological:      Mental Status: She is alert.         Marland Kitchen

## 2022-10-30 NOTE — Unmapped (Signed)
reschedule MCCLURG'S 1.22 appointment with Eveline Keto next available.

## 2022-11-02 NOTE — Unmapped (Signed)
Called to reschedule 1.22 appointment with Carrie Bush next available left vm

## 2022-11-10 NOTE — Unmapped (Signed)
reschedule MCCLURG'S 1.22 appointment with Grasso next available.

## 2022-11-10 NOTE — Unmapped (Signed)
Left vm

## 2022-12-25 DIAGNOSIS — J309 Allergic rhinitis, unspecified: Principal | ICD-10-CM

## 2022-12-25 MED ORDER — CETIRIZINE 10 MG TABLET
ORAL_TABLET | Freq: Two times a day (BID) | ORAL | 11 refills | 30 days | Status: CP
Start: 2022-12-25 — End: ?
  Filled 2023-01-01: qty 240, 30d supply, fill #0

## 2022-12-25 MED ORDER — FLUTICASONE PROPIONATE 50 MCG/ACTUATION NASAL SPRAY,SUSPENSION
Freq: Two times a day (BID) | NASAL | 2 refills | 30 days
Start: 2022-12-25 — End: 2023-12-25

## 2022-12-25 MED ORDER — FUROSEMIDE 40 MG TABLET
ORAL_TABLET | Freq: Every day | ORAL | 5 refills | 30 days | PRN
Start: 2022-12-25 — End: ?

## 2022-12-25 MED ORDER — NORETHINDRONE ACETATE 5 MG TABLET
ORAL_TABLET | Freq: Every day | ORAL | 11 refills | 15 days | Status: CP
Start: 2022-12-25 — End: 2023-12-25
  Filled 2023-01-01: qty 30, 15d supply, fill #0

## 2022-12-25 NOTE — Unmapped (Signed)
Called patient in reference to her my chart message today about sharp vaginal pains with weakness of muscles.     She states she is not able to have orgasm, having vaginal dryness, and not really able to feel anything in vagina during intercourse. The symptoms have started 3 months to 3 weeks ago.     No urinary incontinence.   Last week she has noticed that she is having issues of urinary urgency. No frequency or dysuria     She is concerned that she may have STD.   Does not have discharge.     Patient was hoping to get an appointment to see Dr Elpidio Galea. I explained to patient she will need to see her gynecologist or PCP for these concerns.     She states that she has placed a call to her PCP for an appointment and will discuss these concerns with them.

## 2022-12-28 IMAGING — MR MR HEAD WO/W CM
17 of 23 series · 34 of 48 positions shown · IV contrast (gadavist)
Comparison: None.

Same day CT

CLINICAL DATA: Neuro deficit, acute, stroke suspected

EXAM:
MRI HEAD WITHOUT AND WITH CONTRAST
MRI CERVICAL SPINE WITHOUT AND WITH CONTRAST
TECHNIQUE: Multiplanar, multiecho pulse sequences of the brain and surrounding
structures were obtained without and with intravenous contrast.
Multiplanar and multiecho pulse sequences of the cervical spine, to
include the craniocervical junction and cervicothoracic junction,
were obtained without and with intravenous contrast.
CONTRAST:  10mL GADAVIST GADOBUTROL 1 MMOL/ML IV SOLN

[Series 5: DWI · axial · 4.0mm · 0.88mm/px · z∈[-74,+66]mm · 4 of 36 slices shown (1 of 6)]
[im 1/36]
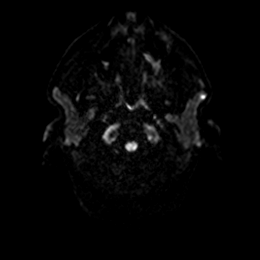
[im 12/36]
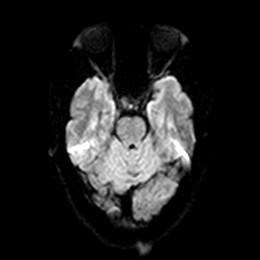
[im 24/36]
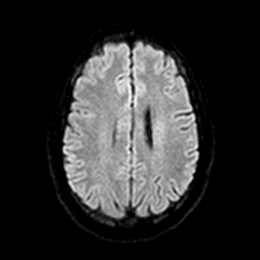
[im 36/36]
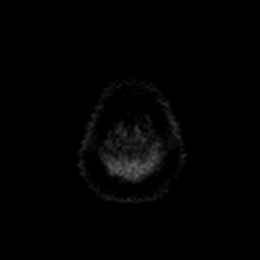

[Series 5: DWI · axial · 4.0mm · 0.88mm/px · z∈[-74,+66]mm · 4 of 36 slices shown (2 of 6)]
[im 1/36]
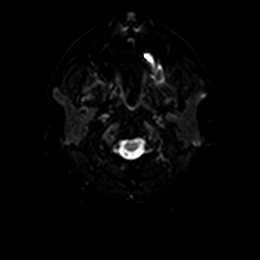
[im 12/36]
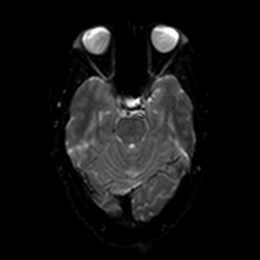
[im 24/36]
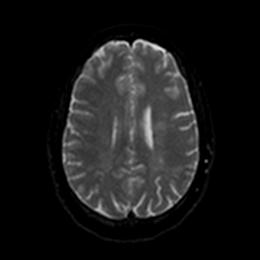
[im 36/36]
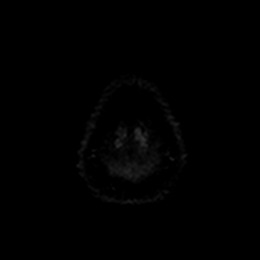

[Series 6: DWI · axial · 4.0mm · 0.88mm/px · z∈[-74,+66]mm · 3 of 36 slices shown (3 of 6)]
[im 1/36]
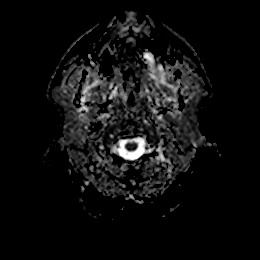
[im 18/36]
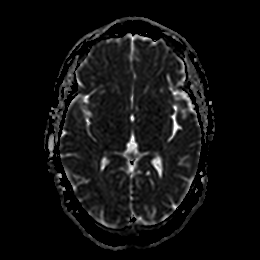
[im 36/36]
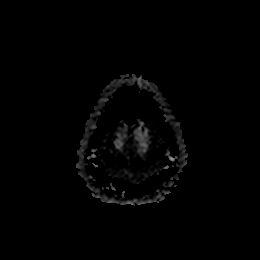

[Series 7: DWI · coronal · 5.0mm · 0.88mm/px · 2 of 28 slices shown (4 of 6)]
[im 1/28]
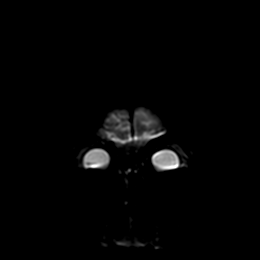
[im 28/28]
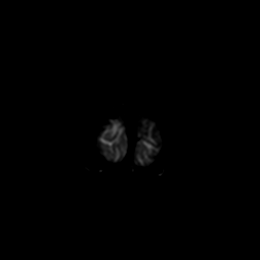

[Series 7: DWI · coronal · 5.0mm · 0.88mm/px · 2 of 28 slices shown (5 of 6)]
[im 1/28]
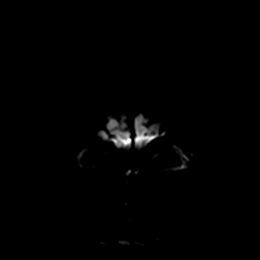
[im 28/28]
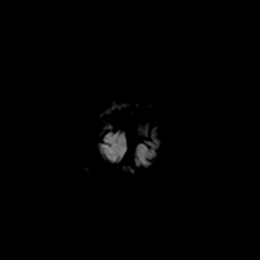

[Series 8: DWI · coronal · 5.0mm · 0.88mm/px · 2 of 28 slices shown (6 of 6)]
[im 1/28]
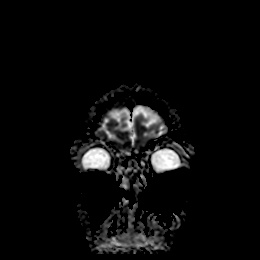
[im 28/28]
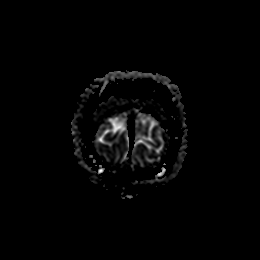

[Series 9: T1 · sagittal · 5.0mm · 0.94mm/px · 1 of 19 slices shown (1 of 2)]
[im 1/19]
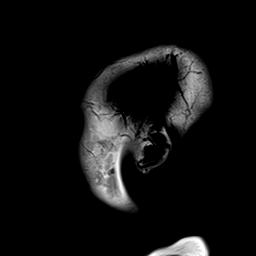

[Series 10: T2 · axial · 5.0mm · 0.72mm/px · 1 of 20 slices shown (1 of 2)]
[im 1/20]
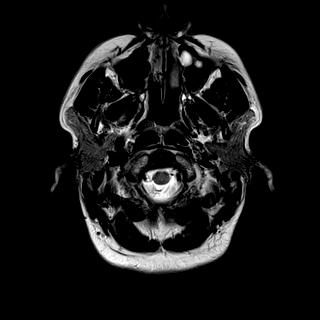

[Series 12: FLAIR · axial · 4.0mm · 0.43mm/px · z∈[-66,+58]mm · 2 of 32 slices shown (1 of 2)]
[im 1/32]
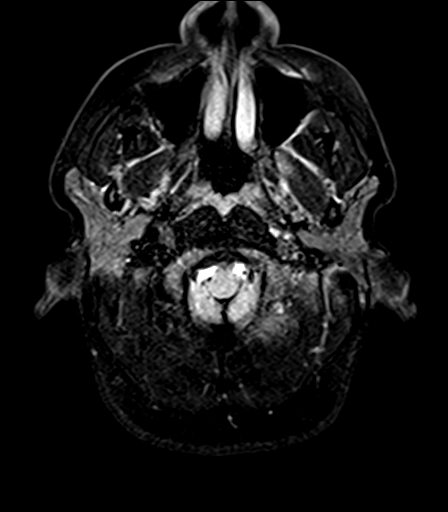
[im 32/32]
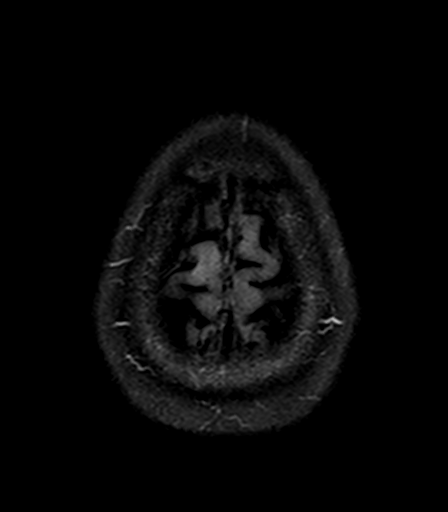

[Series 18: FLAIR · sagittal · 5.0mm · 0.94mm/px · 1 of 17 slices shown (2 of 2)]
[im 1/17]
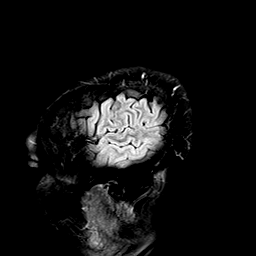

[Series 19: T2 · coronal · 5.0mm · 0.72mm/px · 2 of 26 slices shown (2 of 2)]
[im 1/26]
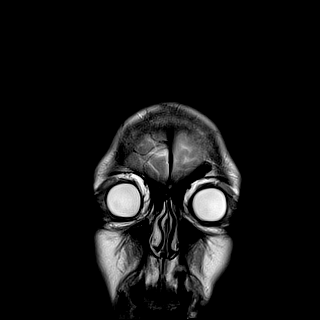
[im 26/26]
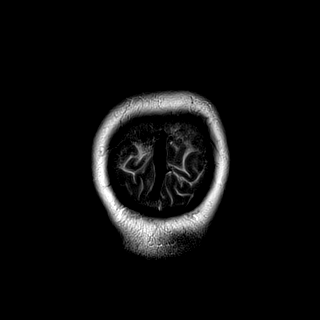

[Series 22: STIR · sagittal · 3.0mm · 0.86mm/px · 1 of 13 slices shown]
[im 1/13]
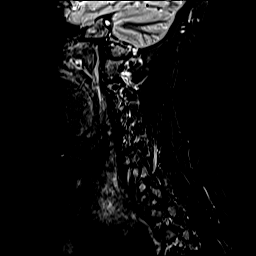

[Series 24: GRE · axial · 3.0mm · 0.78mm/px · z∈[-212,-119]mm · 2 of 30 slices shown]
[im 1/30]
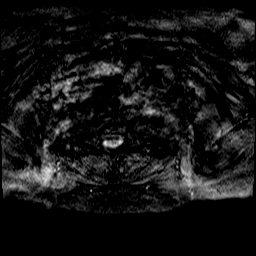
[im 30/30]
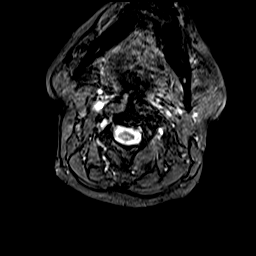

[Series 25: T1 · axial · non-contrast · 3.0mm · 0.35mm/px · z∈[-210,-117]mm · 2 of 30 slices shown (2 of 2)]
[im 1/30]
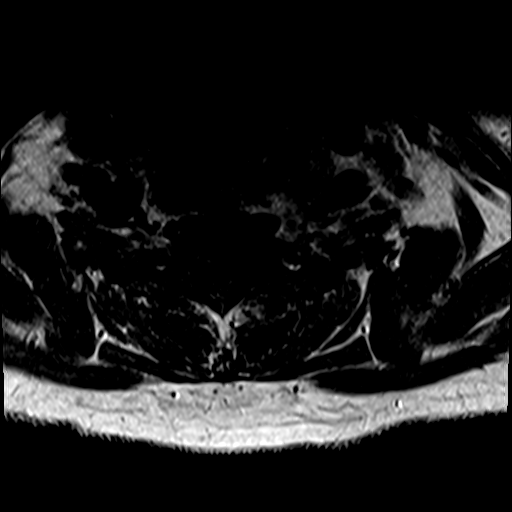
[im 30/30]
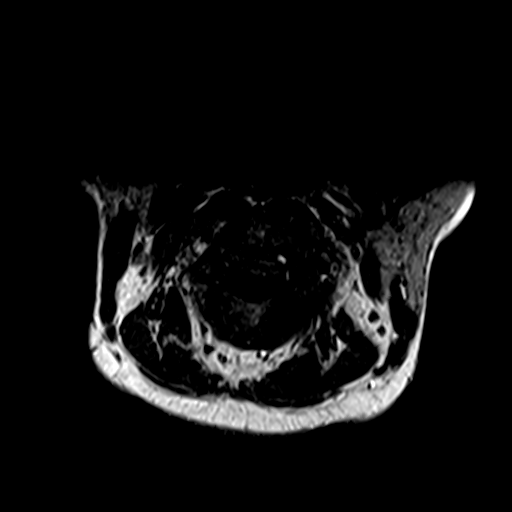

[Series 27: T1 post-contrast · coronal · 5.0mm · 0.34mm/px · 2 of 28 slices shown (1 of 3)]
[im 1/28]
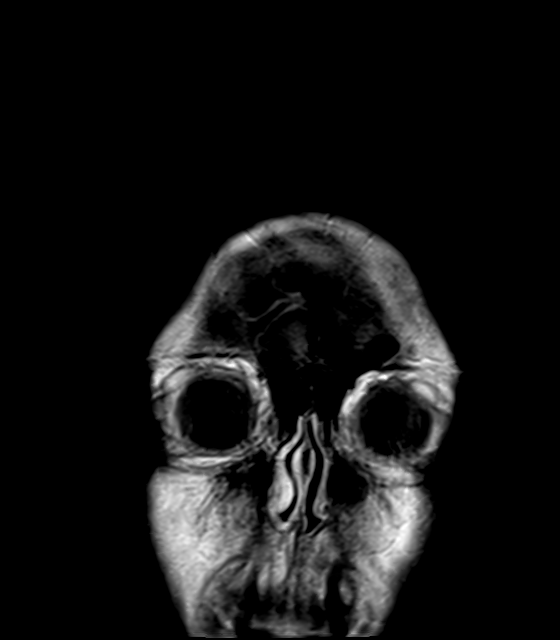
[im 28/28]
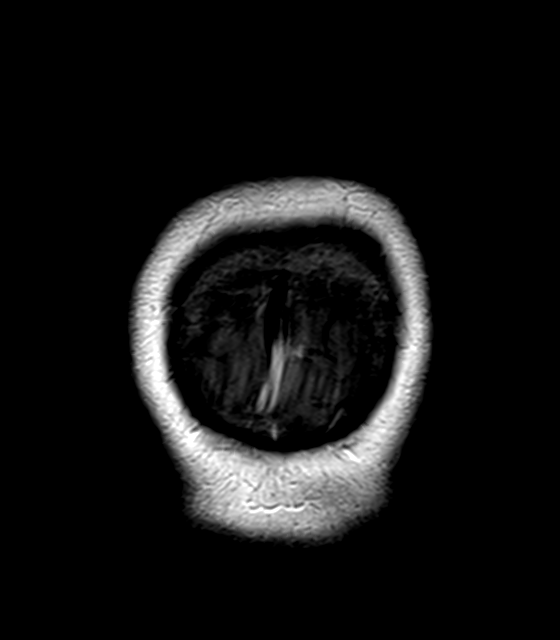

[Series 28: T1 post-contrast · sagittal · 3.0mm · 0.43mm/px · 1 of 13 slices shown (2 of 3)]
[im 1/13]
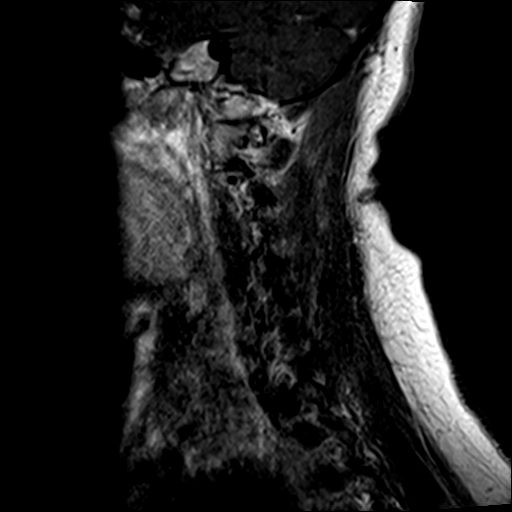

[Series 29: T1 post-contrast · axial · 3.0mm · 0.35mm/px · z∈[-210,-117]mm · 2 of 30 slices shown (3 of 3)]
[im 1/30]
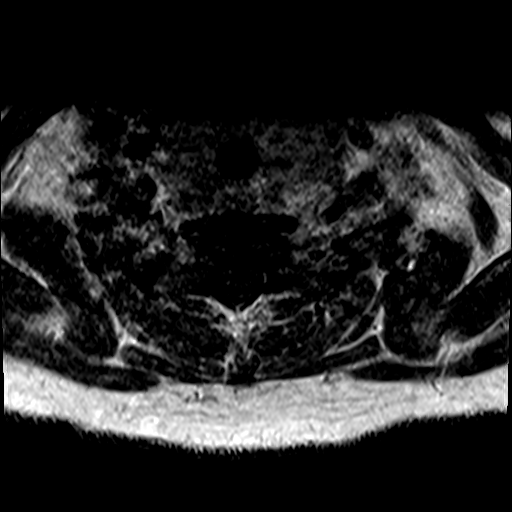
[im 30/30]
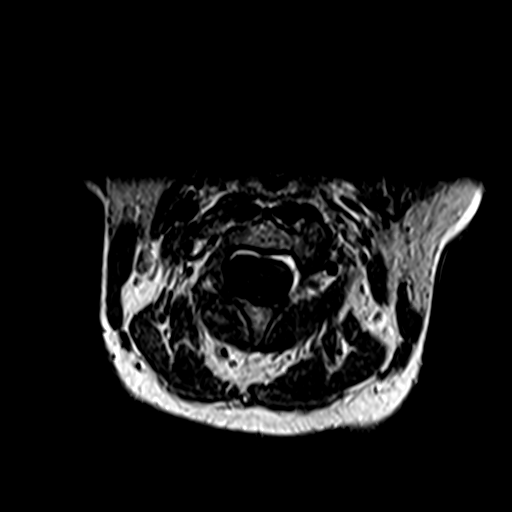

[34 of 48 positions shown; findings below may reference images not displayed]

FINDINGS: MRI HEAD:

Motion limited postcontrast imaging.

Brain: No acute infarction, hemorrhage, hydrocephalus, extra-axial
collection or mass lesion. Remote cortical/subcortical infarct in
the left frontal lobe with multiple additional smaller remote
infarcts in the left frontal and parietal lobes. No abnormal
enhancement on the motion limited postcontrast imaging.

Vascular: Absent left ICA flow void, compatible with occlusion that
was better chacterized on same day CTA.

Skull and upper cervical spine: Diffuse mild T1 hypointensity of the
marrow without focal marrow replacing lesion.

Sinuses/Orbits: Retention cyst in the inferior left maxillary sinus,
partially imaged. Unremarkable orbits.

Other: No mastoid effusions.

MRI CERVICAL SPINE:

Motion limited study, particularly the the GRE and postcontrast
imaging.

Alignment: Slight reversal of the normal cervical lordosis.

Vertebrae: Mild diffuse T1 hypointensity of the bone marrow. No
focal suspicious lesion. No specific evidence of acute fracture or
discitis/osteomyelitis.

Cord: Normal cord signal. No abnormal cord enhancement on motion
limited postcontrast imaging.

Vertebral arteries, paraspinal tissues: Visualized vertebral artery
flow voids are maintained. No paraspinal edema.

Disc levels:

C2-C3: No significant disc protrusion, foraminal stenosis, or canal
stenosis.

C3-C4: Small central disc protrusion which contacts and flattens the
ventral cord without significant canal stenosis. No significant
foraminal stenosis.

C4-C5: Left eccentric posterior disc osteophyte complex, which
contacts and flattens the ventral cord. Left greater than right
uncovertebral hypertrophy. Mild left foraminal stenosis.

C5-C6: Posterior disc osteophyte complex contacts and flattens the
left ventral cord without significant canal stenosis. Left greater
than right facet and uncovertebral hypertrophy with moderate left
foraminal stenosis. No significant right foraminal stenosis.

C6-C7: Left eccentric posterior disc osteophyte complex and left
greater than right uncovertebral hypertrophy. Mild left foraminal
stenosis without significant canal or right foraminal stenosis.

C7-T1: No significant disc protrusion, foraminal stenosis, or canal
stenosis.
IMPRESSION: MRI HEAD:

1. No evidence of acute intracranial abnormality. Specifically, no
acute infarct.
2. Remote cortical/subcortical infarct in the left frontal lobe with
multiple additional smaller remote infarcts in the left frontal and
parietal lobes.
3. Absent left ICA flow void, compatible with occlusion that was
better chacterized on same day CTA.

MRI CERVICAL SPINE:

1. Moderate left foraminal stenosis at C5-C6. Mild left foraminal
stenosis at C4-C5 and C6-C7.
2. Posterior disc osteophyte complexes contact and flatten the
ventral cord at C4-C5 and C5-C6 (and to a lesser extent C3-C4)
without significant canal stenosis.
3. Mild diffuse T1 hypointensity of the marrow, which is nonspecific
but can be seen with chronic anemia, chronic hypoxia (such as in
smokers), obesity, and less commonly lymphoproliferative disorders.
4. Enlarged and heterogeneous thyroid. Recommend non-urgent thyroid
ultrasound.

## 2022-12-28 MED ORDER — FUROSEMIDE 40 MG TABLET
ORAL_TABLET | Freq: Every day | ORAL | 9 refills | 30 days | Status: CP | PRN
Start: 2022-12-28 — End: ?
  Filled 2023-01-01: qty 30, 30d supply, fill #0

## 2022-12-28 MED ORDER — FLUTICASONE PROPIONATE 50 MCG/ACTUATION NASAL SPRAY,SUSPENSION
Freq: Two times a day (BID) | NASAL | 2 refills | 30 days | Status: CP
Start: 2022-12-28 — End: 2023-12-28
  Filled 2023-01-01: qty 16, 15d supply, fill #0

## 2023-01-01 MED FILL — HYDROXYZINE HCL 10 MG TABLET: ORAL | 20 days supply | Qty: 60 | Fill #2

## 2023-01-07 ENCOUNTER — Ambulatory Visit: Admit: 2023-01-07 | Payer: BLUE CROSS/BLUE SHIELD

## 2023-01-11 MED ORDER — HYDROXYZINE HCL 10 MG TABLET
ORAL_TABLET | Freq: Three times a day (TID) | ORAL | 2 refills | 20 days | PRN
Start: 2023-01-11 — End: 2024-01-11

## 2023-01-14 MED ORDER — HYDROXYZINE HCL 10 MG TABLET
ORAL_TABLET | Freq: Three times a day (TID) | ORAL | 2 refills | 20 days | Status: CP | PRN
Start: 2023-01-14 — End: 2024-01-14
  Filled 2023-01-22: qty 60, 20d supply, fill #0

## 2023-02-04 ENCOUNTER — Ambulatory Visit: Admit: 2023-02-04 | Payer: BLUE CROSS/BLUE SHIELD

## 2023-02-18 ENCOUNTER — Ambulatory Visit: Admit: 2023-02-18 | Discharge: 2023-02-19 | Payer: BLUE CROSS/BLUE SHIELD

## 2023-02-18 LAB — HEMOGLOBIN A1C
ESTIMATED AVERAGE GLUCOSE: 108 mg/dL
HEMOGLOBIN A1C: 5.4 % (ref 4.8–5.6)

## 2023-02-18 LAB — T4, FREE: FREE T4: 1.25 ng/dL (ref 0.89–1.76)

## 2023-02-18 MED ORDER — LISINOPRIL 10 MG TABLET
ORAL_TABLET | Freq: Every day | ORAL | 3 refills | 90 days | Status: CP
Start: 2023-02-18 — End: 2024-02-18

## 2023-02-18 MED ORDER — IVERMECTIN 3 MG TABLET
ORAL_TABLET | ORAL | 0 refills | 14 days | Status: CP
Start: 2023-02-18 — End: 2023-02-26

## 2023-02-18 MED ORDER — ONDANSETRON 4 MG DISINTEGRATING TABLET
ORAL_TABLET | Freq: Three times a day (TID) | 0 refills | 10 days | Status: CP | PRN
Start: 2023-02-18 — End: 2023-02-25

## 2023-02-18 NOTE — Unmapped (Signed)
  Silicone based lubricant   

## 2023-02-18 NOTE — Unmapped (Signed)
Problem List Items Addressed This Visit          Cardiovascular and Mediastinum    Essential hypertension    Relevant Medications    lisinopril (PRINIVIL,ZESTRIL) 10 MG tablet     Other Visit Diagnoses       Routine screening for STI (sexually transmitted infection)    -  Primary    Relevant Orders    Wet prep, genital    Chlamydia/Gonorrhoeae NAA    TRICHOMONAS NAA    HIV Antigen/Antibody Combo    Syphilis Screen    Neck nodule        Relevant Orders    US Soft Tissue Head And Neck    TSH    T3, free    T4, free    Vomiting, unspecified vomiting type, unspecified whether nausea present        Relevant Orders    Comprehensive Metabolic Panel    Hemoglobin A1c          Concern for scabies and prescription sent in with instruction. Follow up in 6 weeks.     Chief Complaint   Patient presents with    STI screening       HPI: worried about STI. Has been throwing up since the weekend. Throat has been hurting. Hard to swallow.     Has vaginal discharge. Has brown discharge. Has had some abdominal pain but otherwise has been fine. Feels discharge has an odor. Ongoing since surgery. Was tested for STI's in January. Has been having vaginal dryness. Does not use lubricant. Noticed new rash between fingers. Similar to other dermatitis but not responsive to steroid cream. Itchy and more raised.     Has appointment on 5/15. Feels like nodule is impacting swallowing. Did not take blood pressure medication this morning.   Review of Systems   Constitutional:  Negative for activity change, appetite change, chills, diaphoresis, fatigue and fever.   Respiratory:  Negative for chest tightness, shortness of breath and wheezing.    Cardiovascular:  Negative for chest pain, palpitations and leg swelling.   Genitourinary:  Positive for dyspareunia and vaginal discharge. Negative for vaginal bleeding.      The following portions of the patient's history were reviewed and updated as appropriate: allergies, current medications, past family history, past medical history, past social history, past surgical history, and problem list.  Observations:  Vitals:    02/18/23 1436   BP: 146/109   Pulse: 118   Temp: 36.2 ??C (97.2 ??F)       Physical Exam  Vitals and nursing note reviewed.   Constitutional:       Appearance: Normal appearance.   Cardiovascular:      Rate and Rhythm: Regular rhythm. Tachycardia present.      Heart sounds: No murmur heard.     No friction rub. No gallop.   Pulmonary:      Effort: Pulmonary effort is normal. No respiratory distress.      Breath sounds: Normal breath sounds. No wheezing or rales.   Skin:     Comments: Between fingers, multiple papules noted. No rash on wrists   Neurological:      Mental Status: She is alert.         Marland Kitchen

## 2023-02-19 LAB — COMPREHENSIVE METABOLIC PANEL
ALBUMIN: 3.9 g/dL (ref 3.4–5.0)
ALKALINE PHOSPHATASE: 121 U/L — ABNORMAL HIGH (ref 46–116)
ALT (SGPT): 15 U/L (ref 10–49)
ANION GAP: 10 mmol/L (ref 5–14)
AST (SGOT): 18 U/L (ref <=34)
BILIRUBIN TOTAL: 0.6 mg/dL (ref 0.3–1.2)
BLOOD UREA NITROGEN: 10 mg/dL (ref 9–23)
BUN / CREAT RATIO: 14
CALCIUM: 10 mg/dL (ref 8.7–10.4)
CHLORIDE: 106 mmol/L (ref 98–107)
CO2: 23 mmol/L (ref 20.0–31.0)
CREATININE: 0.74 mg/dL
EGFR CKD-EPI (2021) FEMALE: >90 mL/min/{1.73_m2} (ref >=60)
GLUCOSE RANDOM: 101 mg/dL (ref 70–179)
POTASSIUM: 4 mmol/L (ref 3.4–4.8)
PROTEIN TOTAL: 7.7 g/dL (ref 5.7–8.2)
SODIUM: 139 mmol/L (ref 135–145)

## 2023-02-19 LAB — SYPHILIS SCREEN: SYPHILIS RPR SCREEN: NONREACTIVE

## 2023-02-19 LAB — TSH: THYROID STIMULATING HORMONE: 0.484 u[IU]/mL — ABNORMAL LOW (ref 0.550–4.780)

## 2023-02-19 LAB — T3, FREE: T3 FREE: 3.05 pg/mL (ref 2.30–4.20)

## 2023-02-19 MED ORDER — METRONIDAZOLE 500 MG TABLET
ORAL_TABLET | Freq: Two times a day (BID) | ORAL | 0 refills | 7 days | Status: CP
Start: 2023-02-19 — End: 2023-02-26

## 2023-02-19 NOTE — Unmapped (Signed)
Addended byGwenlyn Fudge, Vennessa Affinito L on: 02/19/2023 08:48 AM     Modules accepted: Orders

## 2023-02-19 NOTE — Unmapped (Unsigned)
[facility]  [address]  5th Floor, [address]  [place], [place] [address]      Chief complaint: follow up for CIU    Assessment and Plan:   Carrie Bush is a 42 y.o. woman with chronic autoimmune spontaneous/idiopathic urticaria who was seen in follow-up for CIU.    Chronic spontaneous urticaria, partially controlled: While taking high-dose antihistamines and Xolair, CIU is fairly well controlled but remains just under the surface (as evidenced by return of symptoms with missed dose). We discussed that this likely means she will need to continue therapy for some time. Because of her upcoming referral to Rheumatology for concerns of lupus as well as recent labs consistent with autoimmune thyroid disease, we discussed initiation of hydroxychloroquine (also a treatment for lupus) to see if this better controls her hives (and other disease processes). I encouraged her to continue management of her other conditions, as this is also an important aspect of controlling her hives.  -  Continue Xolair shots every 3 weeks  - Continue 6-8 tablets daily Zyrtec (3-4 tablets BID)  - Continue hydroxychloroquine/Plaquenil 200 mg BID  - Continue hydroxyzine/Atarax 10 mg q8H PRN (in place of Benadryl)    Atopic dermatitis of hands (dyshidrotic eczema): The diagnosis, natural history, and management of AD were discussed. A written AD care plan was reviewed and provided to the family. I recommended avoidance of aggravating factors (such as fragrant soaps or lotions) and liberal use of bland topical emollients (such as petroleum jelly, Cerave, Aquafor, Cetaphil or Eucerin creams/ointments). She also has previously prescribed topical corticosteroid creams to use. I reviewed proper use, with a thin layer applied to affected areas BID for up to 2 weeks; area should become smooth, after which patient will apply for a few extra days then stop.   - Continue current regimen    Chronic allergic rhinitis: Sensitized to DP/DF dust mite, cat, dog, cockroach, molds, trees, grasses, and weeds.   - Continue Flonase 2 sprays per nostril QD; can use 2 sprays per nostril BID x 2 weeks for worsened symptoms  - Allergen immunotherapy is an option should her symptoms persist despite medical intervention or by patient preference    No follow-ups on file.    I personally spent *** minutes face-to-face and non-face-to-face in the care of this patient, which includes all pre, intra, and post visit time on the date of service.  All documented time was specific to the E/M visit and does not include any procedures that may have been performed.       No orders of the defined types were placed in this encounter.      Claudine Mouton, MD, PhD  Allergy and Immunology    Subjective:   Last clinic visit: 10/10/2021    INTERVAL HISTORY OF PRESENT ILLNESS:  Carrie Bush is a 42 y.o. woman with chronic spontaneous/idiopathic urticaria who presented for follow-up of CSU on Xolair (covered by [employer]). I independently reviewed available records since the patient's last visit, which are shown below.        ROS: Pertinent positive and negatives included in the above HPI. All other systems reviewed are negative.    Interval records:  Last visit with me: [name] has had a lot of health issues come up since her last visit. Recently her TFTs were abnormal and her PCP will refer her to Endocrinology. She reports losing 50 pounds in the past year, initially this was intentional but  more recently was not. She reports heat intolerance and severe fatigue (but also has iron deficiency anemia). Denies diarrhea, skin changes (my skin is always dry), heart palpitations. Vital signs today do not show tachycardia. She was referred to Rheumatology for lupus evaluation but cannot be seen until March 2023.    Regarding her Xolair: [name] can definitely tell when she is late for her next Xolair shot. She last went 28 days between shots (and received her shot today). She reports having more itching, especially around her eyes and hands. She continues to use 3-4 tablets of Zyrtec BID and Benadryl PRN. Benadryl makes her sleepy. She has never tried hydroxyzine. She is interested in at-home Xolair administration.    Sometimes gets blisters on hands, especially in between her fingers. Uses lotions and has been given corticosteroid creams to use, which help.      Messaging with me 10/2022:        Past Medical/Surgical/Allergy/Immunization/Social/Family History:  Reviewed and updated in Epic since last visit on 10/10/2021.    Current Outpatient Medications   Medication Sig Dispense Refill    acetaminophen (TYLENOL) 325 MG tablet Take 2 tablets (650 mg total) by mouth every six (6) hours. (Patient taking differently: Take 2 tablets (650 mg total) by mouth every six (6) hours. As needed) 30 tablet 0    cetirizine (ZYRTEC) 10 MG tablet Take 3-4 tablets (30 - 40 mg total) by mouth twice daily for chronic hives. 240 tablet 11    cyclobenzaprine (FLEXERIL) 10 MG tablet Take 1 tablet (10 mg total) by mouth Three (3) times a day as needed for muscle spasms. 30 tablet 0    empty container Misc Use as directed to dispose of syringes 1 each 2    EPINEPHrine (AUVI-Q) 0.3 mg/0.3 mL injection Inject the contents of 1 syringe (0.3 mg total) into the muscle once for 1 dose as needed. 2 each 4    ferrous sulfate (FEROSUL) 325 (65 FE) MG tablet Take 1 tablet (325 mg total) by mouth daily. 30 tablet 4    fluticasone propionate (FLONASE) 50 mcg/actuation nasal spray Use 2 sprays into each nostril Two (2) times a day. 16 g 2    furosemide (LASIX) 40 MG tablet Take 1 tablet (40 mg total) by mouth daily as needed. 30 tablet 9    hydrOXYzine (ATARAX) 10 MG tablet Take 1 tablet (10 mg total) by mouth every eight (8) hours as needed for itching. 60 tablet 2    ibuprofen (MOTRIN) 600 MG tablet Take 1 tablet (600 mg total) by mouth Three (3) times a day. 60 tablet 0    ivermectin (STROMECTOL) 3 mg Tab Take 6.5 tablets (19,500 mcg total) by mouth once a week for 2 doses. 13 tablet 0    lisinopril (PRINIVIL,ZESTRIL) 10 MG tablet Take 1 tablet (10 mg total) by mouth daily. For high blood pressure 90 tablet 3    metroNIDAZOLE (FLAGYL) 500 MG tablet Take 1 tablet (500 mg total) by mouth two (2) times a day for 7 days. 14 tablet 0    norethindrone (AYGESTIN) 5 mg tablet Take 2 tablets (10 mg total) by mouth daily. 30 tablet 11    omalizumab (XOLAIR) 150 mg/mL syringe Inject the contents of 2 syringes (300 mg total) under the skin every twenty-eight (28) days. 2 mL 11    ondansetron (ZOFRAN-ODT) 4 MG disintegrating tablet Dissolve 1 tablet (4 mg total) in the mouth every eight (8) hours as needed for nausea for up  to 7 days. 30 tablet 0    simvastatin (ZOCOR) 40 MG tablet Take 1 tablet (40 mg total) by mouth every evening. 30 tablet 4     No current facility-administered medications for this visit.         Objective:   PHYSICAL EXAM:   Vitals: There were no vitals taken for this visit.  Constitutional: Young woman sitting comfortably in no acute distress. Pleasant, conversant.  Head, Eyes, Ears, Nose, Throat, Neck: Sclera anicteric, conjunctivae non-injected. Sinuses non-tender to palpation.   Respiratory: Normal work of breathing on room air. Able to speak in full sentences without issues.  Musculoskeletal: Normal muscle bulk and tone. Spontaneously moving all extremities without difficulty.  Skin: No eczema, hives or swelling. Warm and well perfused without cyanosis, clubbing, or edema.  Neurologic: Awake, alert and oriented to person, place, and time.  Psychiatric: Normal affect and mood. Thought process linear.    Laboratory testing independently reviewed and interpreted below:  Component      Latest Ref Rng 10/29/2022   WBC      3.6 - 11.2 10*9/L 5.9    RBC      3.95 - 5.13 10*12/L 3.64 (L)    HGB      11.3 - 14.9 g/dL 96.0 (L)    HCT      45.4 - 44.0 % 31.0 (L)    MCV      77.6 - 95.7 fL 85.2    MCH      25.9 - 32.4 pg 28.1    MCHC      32.0 - 36.0 g/dL 09.8    RDW      11.9 - 15.2 % 16.2 (H)    MPV      6.8 - 10.7 fL 7.1    Platelet      150 - 450 10*9/L 436    Neutrophils %      % 54.8    Lymphocytes %      % 29.5    Monocytes %      % 12.9    Eosinophils %      % 2.3    Basophils %      % 0.5    Absolute Neutrophils      1.8 - 7.8 10*9/L 3.2    Absolute Lymphocytes      1.1 - 3.6 10*9/L 1.7    Absolute Monocytes       0.3 - 0.8 10*9/L 0.8    Absolute Eosinophils      0.0 - 0.5 10*9/L 0.1    Absolute Basophils       0.0 - 0.1 10*9/L 0.0    Anisocytosis      Not Present  Slight !    Sodium      135 - 145 mmol/L 142    Potassium      3.4 - 4.8 mmol/L 4.1    Chloride      98 - 107 mmol/L 113 (H)    CO2      20.0 - 31.0 mmol/L 25.0    Anion Gap      5 - 14 mmol/L 4 (L)    Bun      9 - 23 mg/dL 8 (L)    Creatinine      0.55 - 1.02 mg/dL 1.47    BUN/Creatinine Ratio 12    eGFR CKD-EPI (2021) Female      >=60 mL/min/1.89m2 >90    Glucose  70 - 179 mg/dL 89    Calcium      8.7 - 10.4 mg/dL 9.2    Albumin      3.4 - 5.0 g/dL 2.8 (L)    Total Protein      5.7 - 8.2 g/dL 7.0    Total Bilirubin      0.3 - 1.2 mg/dL 0.2 (L)    SGOT (AST)      <=34 U/L 14    ALT      10 - 49 U/L 11    Alkaline Phosphatase      46 - 116 U/L 75    Lipase      12 - 53 U/L 48       Legend:  (L) Low  (H) High  ! Abnormal    Interpretation: Normocytic anemia with elevated RDW and anisocytosis. Elevated chloride, low albumin and total bilirubin. Otherwise LFTs are WNL.      Imaging independently reviewed and interpreted below:  Impression      Normal chest.     Narrative   EXAM: XR CHEST 2 VIEWS   ACCESSION: [id number] UN      CLINICAL INDICATION: [place] ; CHEST PAIN  - R07.9 - Chest pain, unspecified type        TECHNIQUE: PA and Lateral Chest Radiographs.      COMPARISON: 10/10/2022      FINDINGS:      Lungs are clear.  No pleural effusion or pneumothorax.      Normal heart size and mediastinal contours.     Interpretation: Agree with radiologist's read.      Testing performed in clinic today:  None indicated.

## 2023-02-22 LAB — HIV ANTIGEN/ANTIBODY COMBO: HIV ANTIGEN/ANTIBODY COMBO: NONREACTIVE

## 2023-02-24 ENCOUNTER — Ambulatory Visit: Admit: 2023-02-24 | Payer: BLUE CROSS/BLUE SHIELD

## 2023-02-26 NOTE — Unmapped (Unsigned)
[facility]  [address]  5th Floor, [address]  [place], [place] [address]      Chief complaint: follow up for CIU    Assessment and Plan:   Carrie Bush is a 42 y.o. woman with chronic autoimmune spontaneous/idiopathic urticaria who was seen in follow-up for CIU.    Chronic spontaneous urticaria, partially controlled: While taking high-dose antihistamines and Xolair, CIU is fairly well controlled but remains just under the surface (as evidenced by return of symptoms with missed dose). We discussed that this likely means she will need to continue therapy for some time. Because of her upcoming referral to Rheumatology for concerns of lupus as well as recent labs consistent with autoimmune thyroid disease, we discussed initiation of hydroxychloroquine (also a treatment for lupus) to see if this better controls her hives (and other disease processes). I encouraged her to continue management of her other conditions, as this is also an important aspect of controlling her hives.  -  Continue Xolair shots every 3 weeks  - Continue 6-8 tablets daily Zyrtec (3-4 tablets BID)  - Continue hydroxychloroquine/Plaquenil 200 mg BID  - Continue hydroxyzine/Atarax 10 mg q8H PRN (in place of Benadryl)    Atopic dermatitis of hands (dyshidrotic eczema): The diagnosis, natural history, and management of AD were discussed. A written AD care plan was reviewed and provided to the family. I recommended avoidance of aggravating factors (such as fragrant soaps or lotions) and liberal use of bland topical emollients (such as petroleum jelly, Cerave, Aquafor, Cetaphil or Eucerin creams/ointments). She also has previously prescribed topical corticosteroid creams to use. I reviewed proper use, with a thin layer applied to affected areas BID for up to 2 weeks; area should become smooth, after which patient will apply for a few extra days then stop.   - Continue current regimen    Chronic allergic rhinitis: Sensitized to DP/DF dust mite, cat, dog, cockroach, molds, trees, grasses, and weeds.   - Continue Flonase 2 sprays per nostril QD; can use 2 sprays per nostril BID x 2 weeks for worsened symptoms  - Allergen immunotherapy is an option should her symptoms persist despite medical intervention or by patient preference    No follow-ups on file.    I personally spent *** minutes face-to-face and non-face-to-face in the care of this patient, which includes all pre, intra, and post visit time on the date of service.  All documented time was specific to the E/M visit and does not include any procedures that may have been performed.       No orders of the defined types were placed in this encounter.      Claudine Mouton, MD, PhD  Allergy and Immunology    Subjective:   Last clinic visit: 10/10/2021    INTERVAL HISTORY OF PRESENT ILLNESS:  Carrie Bush is a 42 y.o. woman with chronic spontaneous/idiopathic urticaria who presented for follow-up of CSU on Xolair (covered by [employer]). I independently reviewed available records since the patient's last visit, which are shown below.        ROS: Pertinent positive and negatives included in the above HPI. All other systems reviewed are negative.    Interval records:  Last visit with me: Carrie Bush has had a lot of health issues come up since her last visit. Recently her TFTs were abnormal and her PCP will refer her to Endocrinology. She reports losing 50 pounds in the past year, initially this was intentional but  more recently was not. She reports heat intolerance and severe fatigue (but also has iron deficiency anemia). Denies diarrhea, skin changes (my skin is always dry), heart palpitations. Vital signs today do not show tachycardia. She was referred to Rheumatology for lupus evaluation but cannot be seen until March 2023.    Regarding her Xolair: Carrie Bush can definitely tell when she is late for her next Xolair shot. She last went 28 days between shots (and received her shot today). She reports having more itching, especially around her eyes and hands. She continues to use 3-4 tablets of Zyrtec BID and Benadryl PRN. Benadryl makes her sleepy. She has never tried hydroxyzine. She is interested in at-home Xolair administration.    Sometimes gets blisters on hands, especially in between her fingers. Uses lotions and has been given corticosteroid creams to use, which help.      Messaging with me 10/2022:        Past Medical/Surgical/Allergy/Immunization/Social/Family History:  Reviewed and updated in Epic since last visit on 10/10/2021.    Current Outpatient Medications   Medication Sig Dispense Refill    acetaminophen (TYLENOL) 325 MG tablet Take 2 tablets (650 mg total) by mouth every six (6) hours. (Patient taking differently: Take 2 tablets (650 mg total) by mouth every six (6) hours. As needed) 30 tablet 0    cetirizine (ZYRTEC) 10 MG tablet Take 3-4 tablets (30 - 40 mg total) by mouth twice daily for chronic hives. 240 tablet 11    cyclobenzaprine (FLEXERIL) 10 MG tablet Take 1 tablet (10 mg total) by mouth Three (3) times a day as needed for muscle spasms. 30 tablet 0    empty container Misc Use as directed to dispose of syringes 1 each 2    EPINEPHrine (AUVI-Q) 0.3 mg/0.3 mL injection Inject the contents of 1 syringe (0.3 mg total) into the muscle once for 1 dose as needed. 2 each 4    ferrous sulfate (FEROSUL) 325 (65 FE) MG tablet Take 1 tablet (325 mg total) by mouth daily. 30 tablet 4    fluticasone propionate (FLONASE) 50 mcg/actuation nasal spray Use 2 sprays into each nostril Two (2) times a day. 16 g 2    furosemide (LASIX) 40 MG tablet Take 1 tablet (40 mg total) by mouth daily as needed. 30 tablet 9    hydrOXYzine (ATARAX) 10 MG tablet Take 1 tablet (10 mg total) by mouth every eight (8) hours as needed for itching. 60 tablet 2    ibuprofen (MOTRIN) 600 MG tablet Take 1 tablet (600 mg total) by mouth Three (3) times a day. 60 tablet 0    ivermectin (STROMECTOL) 3 mg Tab Take 6.5 tablets (19,500 mcg total) by mouth once a week for 2 doses. 13 tablet 0    lisinopril (PRINIVIL,ZESTRIL) 10 MG tablet Take 1 tablet (10 mg total) by mouth daily. For high blood pressure 90 tablet 3    metroNIDAZOLE (FLAGYL) 500 MG tablet Take 1 tablet (500 mg total) by mouth two (2) times a day for 7 days. 14 tablet 0    norethindrone (AYGESTIN) 5 mg tablet Take 2 tablets (10 mg total) by mouth daily. 30 tablet 11    omalizumab (XOLAIR) 150 mg/mL syringe Inject the contents of 2 syringes (300 mg total) under the skin every twenty-eight (28) days. 2 mL 11    simvastatin (ZOCOR) 40 MG tablet Take 1 tablet (40 mg total) by mouth every evening. 30 tablet 4     No current facility-administered  medications for this visit.         Objective:   PHYSICAL EXAM:   Vitals: There were no vitals taken for this visit.  Constitutional: Young woman sitting comfortably in no acute distress. Pleasant, conversant.  Head, Eyes, Ears, Nose, Throat, Neck: Sclera anicteric, conjunctivae non-injected. Sinuses non-tender to palpation.   Respiratory: Normal work of breathing on room air. Able to speak in full sentences without issues.  Musculoskeletal: Normal muscle bulk and tone. Spontaneously moving all extremities without difficulty.  Skin: No eczema, hives or swelling. Warm and well perfused without cyanosis, clubbing, or edema.  Neurologic: Awake, alert and oriented to person, place, and time.  Psychiatric: Normal affect and mood. Thought process linear.    Laboratory testing independently reviewed and interpreted below:  Component      Latest Ref Rng 10/29/2022   WBC      3.6 - 11.2 10*9/L 5.9    RBC      3.95 - 5.13 10*12/L 3.64 (L)    HGB      11.3 - 14.9 g/dL 65.7 (L)    HCT      84.6 - 44.0 % 31.0 (L)    MCV      77.6 - 95.7 fL 85.2    MCH      25.9 - 32.4 pg 28.1    MCHC      32.0 - 36.0 g/dL 96.2    RDW      95.2 - 15.2 % 16.2 (H)    MPV      6.8 - 10.7 fL 7.1    Platelet      150 - 450 10*9/L 436    Neutrophils %      % 54.8    Lymphocytes %      % 29.5    Monocytes %      % 12.9    Eosinophils %      % 2.3    Basophils %      % 0.5    Absolute Neutrophils      1.8 - 7.8 10*9/L 3.2    Absolute Lymphocytes      1.1 - 3.6 10*9/L 1.7    Absolute Monocytes       0.3 - 0.8 10*9/L 0.8    Absolute Eosinophils      0.0 - 0.5 10*9/L 0.1    Absolute Basophils       0.0 - 0.1 10*9/L 0.0    Anisocytosis      Not Present  Slight !    Sodium      135 - 145 mmol/L 142    Potassium      3.4 - 4.8 mmol/L 4.1    Chloride      98 - 107 mmol/L 113 (H)    CO2      20.0 - 31.0 mmol/L 25.0    Anion Gap      5 - 14 mmol/L 4 (L)    Bun      9 - 23 mg/dL 8 (L)    Creatinine      0.55 - 1.02 mg/dL 8.41    BUN/Creatinine Ratio 12    eGFR CKD-EPI (2021) Female      >=60 mL/min/1.31m2 >90    Glucose      70 - 179 mg/dL 89    Calcium      8.7 - 10.4 mg/dL 9.2    Albumin      3.4 - 5.0  g/dL 2.8 (L)    Total Protein      5.7 - 8.2 g/dL 7.0    Total Bilirubin      0.3 - 1.2 mg/dL 0.2 (L)    SGOT (AST)      <=34 U/L 14    ALT      10 - 49 U/L 11    Alkaline Phosphatase      46 - 116 U/L 75    Lipase      12 - 53 U/L 48       Legend:  (L) Low  (H) High  ! Abnormal    Interpretation: Normocytic anemia with elevated RDW and anisocytosis. Elevated chloride, low albumin and total bilirubin. Otherwise LFTs are WNL.      Imaging independently reviewed and interpreted below:  Impression      Normal chest.     Narrative   EXAM: XR CHEST 2 VIEWS   ACCESSION: [id number] UN      CLINICAL INDICATION: [place] ; CHEST PAIN  - R07.9 - Chest pain, unspecified type        TECHNIQUE: PA and Lateral Chest Radiographs.      COMPARISON: 10/10/2022      FINDINGS:      Lungs are clear.  No pleural effusion or pneumothorax.      Normal heart size and mediastinal contours.     Interpretation: Agree with radiologist's read.      Testing performed in clinic today:  None indicated.

## 2023-02-26 NOTE — Unmapped (Incomplete)
[facility] Immunology [facility]  [address]  5th Floor, [address]  [place], [place] [address]        Your provider today was: Claudine Mouton (Attending)    It was a pleasure seeing you again today! Here is your plan:    Thank you for letting us be involved with your medical care!    Contact Information:      Appointments and Referrals   [phone]     Emergencies, refills, medical questions    [phone]  Ask for the Allergy/Immunology Doctor on call     You can also use [email or web address] ([email or web address] to request refills, access test results, and send questions to your doctor! However, you should expect to get a response within 2-3 days after sending your message (and not immediately). If it is an urgent issue, please contact the on call physician (see above).

## 2023-03-14 MED ORDER — CLOTRIMAZOLE-BETAMETHASONE 1 %-0.05 % TOPICAL CREAM
Freq: Every day | TOPICAL | 0 refills | 60 days
Start: 2023-03-14 — End: ?

## 2023-03-24 ENCOUNTER — Ambulatory Visit: Admit: 2023-03-24 | Discharge: 2023-03-25 | Payer: BLUE CROSS/BLUE SHIELD

## 2023-03-25 MED ORDER — CLOTRIMAZOLE-BETAMETHASONE 1 %-0.05 % TOPICAL CREAM
1 refills | 0 days | Status: CP
Start: 2023-03-25 — End: 2024-03-24

## 2023-03-26 MED ORDER — LISINOPRIL 10 MG TABLET
ORAL_TABLET | Freq: Every day | ORAL | 3 refills | 90 days | Status: CP
Start: 2023-03-26 — End: 2024-03-25

## 2023-03-26 MED ORDER — CLOTRIMAZOLE-BETAMETHASONE 1 %-0.05 % TOPICAL CREAM
1 refills | 0 days
Start: 2023-03-26 — End: 2024-03-25

## 2023-04-02 NOTE — Unmapped (Signed)
Specialty Medication(s): Xolair    Carrie Bush has been dis-enrolled from the [facility] specialty pharmacy services due to multiple unsuccessful outreach attempts by the pharmacy.    Additional information provided to the patient: n/a    [name], PharmD  [facility] Specialty Pharmacist

## 2023-04-13 ENCOUNTER — Ambulatory Visit: Admit: 2023-04-13

## 2023-04-30 ENCOUNTER — Ambulatory Visit: Admit: 2023-04-30 | Discharge: 2023-04-30 | Payer: PRIVATE HEALTH INSURANCE

## 2023-04-30 ENCOUNTER — Ambulatory Visit
Admit: 2023-04-30 | Discharge: 2023-04-30 | Payer: PRIVATE HEALTH INSURANCE | Attending: Internal Medicine | Primary: Internal Medicine

## 2023-04-30 DIAGNOSIS — E079 Disorder of thyroid, unspecified: Principal | ICD-10-CM

## 2023-04-30 DIAGNOSIS — I6522 Occlusion and stenosis of left carotid artery: Principal | ICD-10-CM

## 2023-04-30 DIAGNOSIS — Z8673 Personal history of transient ischemic attack (TIA), and cerebral infarction without residual deficits: Principal | ICD-10-CM

## 2023-04-30 DIAGNOSIS — H5789 Other specified disorders of eye and adnexa: Principal | ICD-10-CM

## 2023-04-30 DIAGNOSIS — E042 Nontoxic multinodular goiter: Principal | ICD-10-CM

## 2023-04-30 DIAGNOSIS — I10 Essential (primary) hypertension: Principal | ICD-10-CM

## 2023-04-30 DIAGNOSIS — R0602 Shortness of breath: Principal | ICD-10-CM

## 2023-04-30 DIAGNOSIS — E0500 Thyrotoxicosis with diffuse goiter without thyrotoxic crisis or storm: Principal | ICD-10-CM

## 2023-04-30 DIAGNOSIS — R609 Edema, unspecified: Principal | ICD-10-CM

## 2023-04-30 MED ORDER — PRAVASTATIN 20 MG TABLET
ORAL_TABLET | Freq: Every day | ORAL | 11 refills | 30 days | Status: CP
Start: 2023-04-30 — End: 2024-04-29

## 2023-05-25 ENCOUNTER — Ambulatory Visit: Admit: 2023-05-25 | Payer: PRIVATE HEALTH INSURANCE

## 2023-06-01 ENCOUNTER — Ambulatory Visit: Admit: 2023-06-01 | Payer: PRIVATE HEALTH INSURANCE

## 2023-06-25 DIAGNOSIS — I10 Essential (primary) hypertension: Principal | ICD-10-CM

## 2023-06-25 DIAGNOSIS — R0602 Shortness of breath: Principal | ICD-10-CM

## 2023-06-25 DIAGNOSIS — R609 Edema, unspecified: Principal | ICD-10-CM

## 2023-06-26 MED ORDER — CLOTRIMAZOLE-BETAMETHASONE 1 %-0.05 % TOPICAL CREAM
1 refills | 0 days
Start: 2023-06-26 — End: 2024-06-25

## 2023-06-28 MED ORDER — CLOTRIMAZOLE-BETAMETHASONE 1 %-0.05 % TOPICAL CREAM
1 refills | 0 days | Status: CP
Start: 2023-06-28 — End: 2024-06-25

## 2023-07-01 ENCOUNTER — Ambulatory Visit: Admit: 2023-07-01 | Discharge: 2023-07-02 | Payer: PRIVATE HEALTH INSURANCE

## 2023-07-02 ENCOUNTER — Ambulatory Visit: Admit: 2023-07-02 | Discharge: 2023-07-03 | Payer: PRIVATE HEALTH INSURANCE

## 2023-07-02 DIAGNOSIS — J329 Chronic sinusitis, unspecified: Principal | ICD-10-CM

## 2023-07-02 DIAGNOSIS — J309 Allergic rhinitis, unspecified: Principal | ICD-10-CM

## 2023-07-02 DIAGNOSIS — L501 Idiopathic urticaria: Principal | ICD-10-CM

## 2023-07-02 MED ORDER — HYDROXYZINE HCL 10 MG TABLET
ORAL_TABLET | Freq: Three times a day (TID) | ORAL | 2 refills | 20 days | Status: CP | PRN
Start: 2023-07-02 — End: 2024-07-01

## 2023-07-02 MED ORDER — FLUTICASONE PROPIONATE 50 MCG/ACTUATION NASAL SPRAY,SUSPENSION
Freq: Two times a day (BID) | NASAL | 2 refills | 30 days | Status: CP
Start: 2023-07-02 — End: 2024-07-01

## 2023-07-02 MED ORDER — XOLAIR 300 MG/2 ML SUBCUTANEOUS SYRINGE
SUBCUTANEOUS | 11 refills | 56 days | Status: CP
Start: 2023-07-02 — End: ?

## 2023-07-02 MED ORDER — CLOBETASOL 0.05 % TOPICAL CREAM
Freq: Two times a day (BID) | TOPICAL | 11 refills | 0 days | Status: CP
Start: 2023-07-02 — End: 2024-07-01

## 2023-07-06 DIAGNOSIS — Z6835 Body mass index (BMI) 35.0-35.9, adult: Principal | ICD-10-CM

## 2023-07-06 MED ORDER — SEMAGLUTIDE 0.25 MG OR 0.5 MG (2 MG/3 ML) SUBCUTANEOUS PEN INJECTOR
SUBCUTANEOUS | 0 refills | 84 days | Status: CP
Start: 2023-07-06 — End: 2023-10-04

## 2023-07-13 ENCOUNTER — Ambulatory Visit: Admit: 2023-07-13 | Discharge: 2023-07-14 | Payer: PRIVATE HEALTH INSURANCE

## 2023-07-16 ENCOUNTER — Ambulatory Visit: Admit: 2023-07-16 | Discharge: 2023-07-17 | Payer: PRIVATE HEALTH INSURANCE

## 2023-07-16 DIAGNOSIS — I10 Essential (primary) hypertension: Principal | ICD-10-CM

## 2023-07-16 DIAGNOSIS — Z5181 Encounter for therapeutic drug level monitoring: Principal | ICD-10-CM

## 2023-07-16 MED ORDER — CETIRIZINE 10 MG TABLET
ORAL_TABLET | Freq: Two times a day (BID) | ORAL | 11 refills | 30 days | Status: CP
Start: 2023-07-16 — End: ?

## 2023-07-16 MED ORDER — CYCLOBENZAPRINE 10 MG TABLET
ORAL_TABLET | Freq: Three times a day (TID) | ORAL | 0 refills | 10 days | Status: CP | PRN
Start: 2023-07-16 — End: ?

## 2023-07-16 MED ORDER — NORETHINDRONE ACETATE 5 MG TABLET
ORAL_TABLET | ORAL | 11 refills | 15 days | Status: CP
Start: 2023-07-16 — End: 2024-07-15

## 2023-07-16 MED ORDER — NAPROXEN 500 MG TABLET
ORAL_TABLET | Freq: Two times a day (BID) | ORAL | 0 refills | 30 days | Status: CP | PRN
Start: 2023-07-16 — End: 2023-08-15

## 2023-08-16 ENCOUNTER — Ambulatory Visit: Admit: 2023-08-16 | Discharge: 2023-08-17 | Payer: PRIVATE HEALTH INSURANCE

## 2023-08-16 MED ORDER — AMOXICILLIN 875 MG-POTASSIUM CLAVULANATE 125 MG TABLET
ORAL_TABLET | Freq: Two times a day (BID) | ORAL | 0 refills | 7 days | Status: CP
Start: 2023-08-16 — End: 2023-08-23

## 2023-08-16 MED ORDER — POLYMYXIN B SULFATE 10,000 UNIT-TRIMETHOPRIM 1 MG/ML EYE DROPS
Freq: Four times a day (QID) | OPHTHALMIC | 0 refills | 25 days | Status: CP
Start: 2023-08-16 — End: 2023-08-23

## 2023-08-16 MED ORDER — HYDROCHLOROTHIAZIDE 25 MG TABLET
ORAL_TABLET | Freq: Every morning | ORAL | 11 refills | 30 days | Status: CP
Start: 2023-08-16 — End: 2024-08-15

## 2023-09-03 ENCOUNTER — Ambulatory Visit: Admit: 2023-09-03 | Discharge: 2023-09-04 | Payer: PRIVATE HEALTH INSURANCE

## 2023-09-03 DIAGNOSIS — L301 Dyshidrosis [pompholyx]: Principal | ICD-10-CM

## 2023-09-03 DIAGNOSIS — J301 Allergic rhinitis due to pollen: Principal | ICD-10-CM

## 2023-09-03 DIAGNOSIS — L2084 Intrinsic (allergic) eczema: Principal | ICD-10-CM

## 2023-09-03 DIAGNOSIS — L501 Idiopathic urticaria: Principal | ICD-10-CM

## 2023-09-03 DIAGNOSIS — J329 Chronic sinusitis, unspecified: Principal | ICD-10-CM

## 2023-09-03 MED ORDER — AZELASTINE 137 MCG (0.1 %) NASAL SPRAY
Freq: Two times a day (BID) | NASAL | 3 refills | 7 days | Status: CP | PRN
Start: 2023-09-03 — End: 2024-09-02

## 2023-09-03 MED ORDER — PREDNISONE 10 MG TABLET
ORAL_TABLET | 0 refills | 12 days | Status: CP
Start: 2023-09-03 — End: 2023-09-15

## 2023-09-03 MED ORDER — BETAMETHASONE DIPROPIONATE 0.05 % TOPICAL CREAM
Freq: Two times a day (BID) | TOPICAL | 1 refills | 0 days | Status: CP | PRN
Start: 2023-09-03 — End: 2024-09-02

## 2023-09-03 MED ORDER — AMOXICILLIN 875 MG-POTASSIUM CLAVULANATE 125 MG TABLET
ORAL_TABLET | Freq: Two times a day (BID) | ORAL | 0 refills | 21 days | Status: CP
Start: 2023-09-03 — End: 2023-09-24

## 2023-09-06 ENCOUNTER — Ambulatory Visit: Admit: 2023-09-06 | Payer: PRIVATE HEALTH INSURANCE

## 2023-09-14 ENCOUNTER — Ambulatory Visit: Admit: 2023-09-14 | Discharge: 2023-09-15 | Payer: PRIVATE HEALTH INSURANCE

## 2023-09-14 ENCOUNTER — Ambulatory Visit
Admit: 2023-09-14 | Discharge: 2023-09-15 | Payer: PRIVATE HEALTH INSURANCE | Attending: Rehabilitative and Restorative Service Providers" | Primary: Rehabilitative and Restorative Service Providers"

## 2023-09-14 DIAGNOSIS — S93412A Sprain of calcaneofibular ligament of left ankle, initial encounter: Principal | ICD-10-CM

## 2023-09-14 DIAGNOSIS — S86812A Strain of other muscle(s) and tendon(s) at lower leg level, left leg, initial encounter: Principal | ICD-10-CM

## 2023-09-14 MED ORDER — IBUPROFEN 600 MG TABLET
ORAL_TABLET | Freq: Four times a day (QID) | ORAL | 1 refills | 30 days | Status: CP | PRN
Start: 2023-09-14 — End: ?

## 2023-10-04 ENCOUNTER — Ambulatory Visit
Admit: 2023-10-04 | Discharge: 2023-10-05 | Payer: PRIVATE HEALTH INSURANCE | Attending: Rehabilitative and Restorative Service Providers" | Primary: Rehabilitative and Restorative Service Providers"

## 2023-10-04 ENCOUNTER — Ambulatory Visit: Admit: 2023-10-04 | Discharge: 2023-10-05 | Payer: PRIVATE HEALTH INSURANCE

## 2023-10-04 DIAGNOSIS — S93412D Sprain of calcaneofibular ligament of left ankle, subsequent encounter: Principal | ICD-10-CM

## 2023-10-04 DIAGNOSIS — S86812A Strain of other muscle(s) and tendon(s) at lower leg level, left leg, initial encounter: Principal | ICD-10-CM

## 2023-10-04 MED ORDER — CYCLOBENZAPRINE 10 MG TABLET
ORAL_TABLET | Freq: Every evening | ORAL | 0 refills | 20.00 days | Status: CP | PRN
Start: 2023-10-04 — End: ?

## 2023-10-20 ENCOUNTER — Ambulatory Visit: Admit: 2023-10-20 | Discharge: 2023-10-21 | Payer: PRIVATE HEALTH INSURANCE

## 2023-10-20 ENCOUNTER — Inpatient Hospital Stay: Admit: 2023-10-20 | Discharge: 2023-10-21 | Payer: PRIVATE HEALTH INSURANCE

## 2023-10-20 ENCOUNTER — Ambulatory Visit
Admit: 2023-10-20 | Discharge: 2023-10-21 | Payer: PRIVATE HEALTH INSURANCE | Attending: Rehabilitative and Restorative Service Providers" | Primary: Rehabilitative and Restorative Service Providers"

## 2023-10-20 DIAGNOSIS — S93412D Sprain of calcaneofibular ligament of left ankle, subsequent encounter: Principal | ICD-10-CM

## 2023-10-20 DIAGNOSIS — M2392 Unspecified internal derangement of left knee: Principal | ICD-10-CM

## 2023-10-25 MED ORDER — TRAMADOL 50 MG TABLET
ORAL_TABLET | Freq: Three times a day (TID) | ORAL | 0 refills | 7.00 days | Status: CP | PRN
Start: 2023-10-25 — End: ?

## 2023-11-02 DIAGNOSIS — M79662 Pain in left lower leg: Principal | ICD-10-CM

## 2023-11-09 MED ORDER — IBUPROFEN 600 MG TABLET
ORAL_TABLET | Freq: Four times a day (QID) | ORAL | 1 refills | 30 days | Status: CP | PRN
Start: 2023-11-09 — End: ?

## 2023-11-15 ENCOUNTER — Inpatient Hospital Stay: Admit: 2023-11-15 | Discharge: 2023-11-16 | Payer: PRIVATE HEALTH INSURANCE

## 2023-11-15 ENCOUNTER — Ambulatory Visit
Admit: 2023-11-15 | Discharge: 2023-11-16 | Payer: PRIVATE HEALTH INSURANCE | Attending: Rehabilitative and Restorative Service Providers" | Primary: Rehabilitative and Restorative Service Providers"

## 2023-11-17 MED ORDER — TRAMADOL 50 MG TABLET
ORAL_TABLET | Freq: Three times a day (TID) | ORAL | 0 refills | 14.00 days | Status: CP | PRN
Start: 2023-11-17 — End: ?

## 2023-11-19 ENCOUNTER — Inpatient Hospital Stay: Admit: 2023-11-19 | Discharge: 2023-11-20 | Payer: PRIVATE HEALTH INSURANCE

## 2023-11-19 ENCOUNTER — Ambulatory Visit: Admit: 2023-11-19 | Discharge: 2023-11-20 | Payer: PRIVATE HEALTH INSURANCE

## 2023-11-23 ENCOUNTER — Ambulatory Visit: Admit: 2023-11-23 | Discharge: 2023-11-24 | Payer: PRIVATE HEALTH INSURANCE

## 2023-11-23 ENCOUNTER — Ambulatory Visit
Admit: 2023-11-23 | Discharge: 2023-11-24 | Payer: PRIVATE HEALTH INSURANCE | Attending: Rehabilitative and Restorative Service Providers" | Primary: Rehabilitative and Restorative Service Providers"

## 2023-11-23 MED ORDER — TRAMADOL 50 MG TABLET
ORAL_TABLET | Freq: Three times a day (TID) | ORAL | 0 refills | 14.00 days | Status: CP | PRN
Start: 2023-11-23 — End: ?

## 2023-11-25 ENCOUNTER — Ambulatory Visit: Admit: 2023-11-25 | Discharge: 2023-11-26 | Payer: PRIVATE HEALTH INSURANCE

## 2023-11-25 DIAGNOSIS — J329 Chronic sinusitis, unspecified: Principal | ICD-10-CM

## 2023-11-25 DIAGNOSIS — M1710 Unilateral primary osteoarthritis, unspecified knee: Principal | ICD-10-CM

## 2023-11-25 DIAGNOSIS — J301 Allergic rhinitis due to pollen: Principal | ICD-10-CM

## 2023-12-07 ENCOUNTER — Ambulatory Visit: Admit: 2023-12-07 | Discharge: 2023-12-07 | Payer: PRIVATE HEALTH INSURANCE

## 2023-12-07 DIAGNOSIS — H3412 Central retinal artery occlusion, left eye: Principal | ICD-10-CM

## 2023-12-07 DIAGNOSIS — I639 Cerebral infarction, unspecified: Principal | ICD-10-CM

## 2023-12-07 DIAGNOSIS — I6522 Occlusion and stenosis of left carotid artery: Principal | ICD-10-CM

## 2023-12-07 DIAGNOSIS — E0500 Thyrotoxicosis with diffuse goiter without thyrotoxic crisis or storm: Principal | ICD-10-CM

## 2023-12-17 ENCOUNTER — Ambulatory Visit
Admit: 2023-12-17 | Discharge: 2023-12-18 | Payer: PRIVATE HEALTH INSURANCE | Attending: Rehabilitative and Restorative Service Providers" | Primary: Rehabilitative and Restorative Service Providers"

## 2023-12-17 DIAGNOSIS — S93412D Sprain of calcaneofibular ligament of left ankle, subsequent encounter: Principal | ICD-10-CM

## 2024-01-14 MED ORDER — CYCLOBENZAPRINE 10 MG TABLET
ORAL_TABLET | 0 refills | 0 days
Start: 2024-01-14 — End: ?

## 2024-01-18 ENCOUNTER — Ambulatory Visit: Admit: 2024-01-18

## 2024-01-26 MED ORDER — CYCLOBENZAPRINE 10 MG TABLET
ORAL_TABLET | 0 refills | 0.00 days
Start: 2024-01-26 — End: ?

## 2024-01-30 MED ORDER — IBUPROFEN 600 MG TABLET
ORAL_TABLET | Freq: Four times a day (QID) | ORAL | 1 refills | 0.00 days | PRN
Start: 2024-01-30 — End: ?

## 2024-02-15 ENCOUNTER — Ambulatory Visit: Admit: 2024-02-15 | Discharge: 2024-02-16 | Payer: Medicaid (Managed Care)

## 2024-02-15 DIAGNOSIS — J301 Allergic rhinitis due to pollen: Principal | ICD-10-CM

## 2024-02-15 DIAGNOSIS — I10 Essential (primary) hypertension: Principal | ICD-10-CM

## 2024-02-15 DIAGNOSIS — J329 Chronic sinusitis, unspecified: Principal | ICD-10-CM

## 2024-02-15 DIAGNOSIS — Z8673 Personal history of transient ischemic attack (TIA), and cerebral infarction without residual deficits: Principal | ICD-10-CM

## 2024-02-15 DIAGNOSIS — Z1231 Encounter for screening mammogram for malignant neoplasm of breast: Principal | ICD-10-CM

## 2024-02-15 DIAGNOSIS — N920 Excessive and frequent menstruation with regular cycle: Principal | ICD-10-CM

## 2024-02-15 DIAGNOSIS — Z5181 Encounter for therapeutic drug level monitoring: Principal | ICD-10-CM

## 2024-02-15 DIAGNOSIS — N951 Menopausal and female climacteric states: Principal | ICD-10-CM

## 2024-02-15 DIAGNOSIS — Z113 Encounter for screening for infections with a predominantly sexual mode of transmission: Principal | ICD-10-CM

## 2024-02-15 DIAGNOSIS — I69320 Aphasia following cerebral infarction: Principal | ICD-10-CM

## 2024-02-15 MED ORDER — ESCITALOPRAM 10 MG TABLET
ORAL_TABLET | Freq: Every day | ORAL | 3 refills | 100.00000 days | Status: CP
Start: 2024-02-15 — End: 2025-03-21

## 2024-02-15 MED ORDER — TRIAMCINOLONE ACETONIDE 0.1 % TOPICAL CREAM
Freq: Two times a day (BID) | TOPICAL | 2 refills | 0.00000 days | Status: CP
Start: 2024-02-15 — End: 2025-02-14

## 2024-02-15 MED ORDER — PRAVASTATIN 20 MG TABLET
ORAL_TABLET | Freq: Every day | ORAL | 11 refills | 30.00000 days | Status: CP
Start: 2024-02-15 — End: 2025-02-14

## 2024-02-15 MED ORDER — TRIAMCINOLONE ACETONIDE 0.5 % TOPICAL OINTMENT
Freq: Two times a day (BID) | TOPICAL | 2 refills | 0.00000 days | Status: CP
Start: 2024-02-15 — End: ?

## 2024-02-17 MED ORDER — ESTRADIOL 0.01% (0.1 MG/GRAM) VAGINAL CREAM
VAGINAL | 11 refills | 28.00000 days | Status: CP
Start: 2024-02-17 — End: 2025-02-16

## 2024-02-18 ENCOUNTER — Ambulatory Visit: Admit: 2024-02-18 | Discharge: 2024-02-19 | Payer: Medicaid (Managed Care)

## 2024-02-18 DIAGNOSIS — M5417 Radiculopathy, lumbosacral region: Principal | ICD-10-CM

## 2024-02-18 DIAGNOSIS — I7770 Dissection of unspecified artery: Principal | ICD-10-CM

## 2024-02-18 DIAGNOSIS — I639 Cerebral infarction, unspecified: Principal | ICD-10-CM

## 2024-02-18 DIAGNOSIS — G44321 Chronic post-traumatic headache, intractable: Principal | ICD-10-CM

## 2024-02-18 MED ORDER — AMITRIPTYLINE 25 MG TABLET
ORAL_TABLET | Freq: Every evening | ORAL | 3 refills | 90.00000 days | Status: CP
Start: 2024-02-18 — End: 2025-02-17

## 2024-02-21 MED ORDER — TRIAMCINOLONE ACETONIDE 0.5 % TOPICAL OINTMENT
Freq: Two times a day (BID) | TOPICAL | 2 refills | 0.00000 days | Status: CP
Start: 2024-02-21 — End: ?

## 2024-02-21 MED ORDER — FLUTICASONE PROPIONATE 50 MCG/ACTUATION NASAL SPRAY,SUSPENSION
Freq: Two times a day (BID) | NASAL | 2 refills | 30.00000 days | Status: CP
Start: 2024-02-21 — End: 2025-02-20

## 2024-02-21 MED ORDER — AZELASTINE 137 MCG (0.1 %) NASAL SPRAY
Freq: Two times a day (BID) | NASAL | 3 refills | 7.00000 days | Status: CP | PRN
Start: 2024-02-21 — End: 2025-02-20

## 2024-02-21 MED ORDER — CLOBETASOL 0.05 % TOPICAL CREAM
Freq: Two times a day (BID) | TOPICAL | 11 refills | 0.00000 days | Status: CP
Start: 2024-02-21 — End: 2025-02-20

## 2024-02-21 MED ORDER — TRIAMCINOLONE ACETONIDE 0.1 % TOPICAL CREAM
Freq: Two times a day (BID) | TOPICAL | 2 refills | 0.00000 days | Status: CP
Start: 2024-02-21 — End: 2025-02-20

## 2024-02-21 MED ORDER — NORETHINDRONE ACETATE 5 MG TABLET
ORAL_TABLET | Freq: Every day | ORAL | 11 refills | 15.00000 days | Status: CP
Start: 2024-02-21 — End: 2025-02-20

## 2024-02-21 MED ORDER — ESTRADIOL 0.01% (0.1 MG/GRAM) VAGINAL CREAM
VAGINAL | 11 refills | 28.00000 days | Status: CP
Start: 2024-02-21 — End: 2025-02-20

## 2024-02-21 MED ORDER — CYCLOBENZAPRINE 10 MG TABLET
ORAL_TABLET | Freq: Three times a day (TID) | ORAL | 0 refills | 10.00000 days | Status: CP | PRN
Start: 2024-02-21 — End: ?

## 2024-02-21 MED ORDER — BETAMETHASONE DIPROPIONATE 0.05 % TOPICAL CREAM
Freq: Two times a day (BID) | TOPICAL | 1 refills | 0.00000 days | Status: CP | PRN
Start: 2024-02-21 — End: 2025-02-20

## 2024-02-21 MED ORDER — ACETAMINOPHEN 325 MG TABLET
ORAL_TABLET | Freq: Four times a day (QID) | ORAL | 0 refills | 4.00000 days | Status: CP
Start: 2024-02-21 — End: ?

## 2024-02-24 ENCOUNTER — Ambulatory Visit
Admit: 2024-02-24 | Discharge: 2024-02-25 | Payer: Medicaid (Managed Care) | Attending: Internal Medicine | Primary: Internal Medicine

## 2024-02-24 ENCOUNTER — Ambulatory Visit: Admit: 2024-02-24 | Discharge: 2024-02-25 | Payer: Medicaid (Managed Care)

## 2024-02-24 DIAGNOSIS — M2392 Unspecified internal derangement of left knee: Principal | ICD-10-CM

## 2024-02-24 DIAGNOSIS — M1710 Unilateral primary osteoarthritis, unspecified knee: Principal | ICD-10-CM

## 2024-02-24 DIAGNOSIS — S83282D Other tear of lateral meniscus, current injury, left knee, subsequent encounter: Principal | ICD-10-CM

## 2024-02-24 DIAGNOSIS — M25572 Pain in left ankle and joints of left foot: Principal | ICD-10-CM

## 2024-02-24 DIAGNOSIS — G8929 Other chronic pain: Principal | ICD-10-CM

## 2024-02-24 MED ORDER — DICLOFENAC SODIUM 75 MG TABLET,DELAYED RELEASE
ORAL_TABLET | Freq: Two times a day (BID) | ORAL | 2 refills | 30.00000 days | Status: CP
Start: 2024-02-24 — End: 2024-05-24

## 2024-02-25 ENCOUNTER — Ambulatory Visit: Admit: 2024-02-25 | Discharge: 2024-02-26 | Payer: Medicaid (Managed Care)

## 2024-02-25 DIAGNOSIS — J301 Allergic rhinitis due to pollen: Principal | ICD-10-CM

## 2024-02-25 DIAGNOSIS — L2084 Intrinsic (allergic) eczema: Principal | ICD-10-CM

## 2024-02-25 DIAGNOSIS — L501 Idiopathic urticaria: Principal | ICD-10-CM

## 2024-02-25 DIAGNOSIS — J329 Chronic sinusitis, unspecified: Principal | ICD-10-CM

## 2024-02-25 MED ORDER — IBUPROFEN 600 MG TABLET
ORAL_TABLET | Freq: Three times a day (TID) | ORAL | 0 refills | 20.00000 days
Start: 2024-02-25 — End: ?

## 2024-03-03 DIAGNOSIS — Z6835 Body mass index (BMI) 35.0-35.9, adult: Principal | ICD-10-CM

## 2024-03-03 MED ORDER — SEMAGLUTIDE (WEIGHT LOSS) 0.5 MG/0.5 ML SUBCUTANEOUS PEN INJECTOR
SUBCUTANEOUS | 0 refills | 0.00000 days | Status: CP
Start: 2024-03-03 — End: 2024-03-03

## 2024-03-03 MED ORDER — SEMAGLUTIDE (WEIGHT LOSS) 0.25 MG/0.5 ML SUBCUTANEOUS PEN INJECTOR
SUBCUTANEOUS | 0 refills | 0.00000 days
Start: 2024-03-03 — End: 2024-03-03

## 2024-03-10 MED ORDER — EPINEPHRINE 0.3 MG/0.3 ML INJECTION, AUTO-INJECTOR
INTRAMUSCULAR | 4 refills | 1.00000 days | Status: CP | PRN
Start: 2024-03-10 — End: ?

## 2024-03-15 ENCOUNTER — Inpatient Hospital Stay: Admit: 2024-03-15 | Discharge: 2024-03-16 | Payer: Medicaid (Managed Care)

## 2024-03-21 ENCOUNTER — Ambulatory Visit
Admit: 2024-03-21 | Discharge: 2024-03-21 | Payer: Medicaid (Managed Care) | Attending: Internal Medicine | Primary: Internal Medicine

## 2024-03-21 ENCOUNTER — Ambulatory Visit: Admit: 2024-03-21 | Discharge: 2024-03-21 | Payer: Medicaid (Managed Care)

## 2024-03-21 DIAGNOSIS — E0590 Thyrotoxicosis, unspecified without thyrotoxic crisis or storm: Principal | ICD-10-CM

## 2024-03-21 DIAGNOSIS — F32A Depression, unspecified depression type: Principal | ICD-10-CM

## 2024-03-21 DIAGNOSIS — M5030 Other cervical disc degeneration, unspecified cervical region: Principal | ICD-10-CM

## 2024-03-21 DIAGNOSIS — R7989 Other specified abnormal findings of blood chemistry: Principal | ICD-10-CM

## 2024-03-21 DIAGNOSIS — R21 Rash and other nonspecific skin eruption: Principal | ICD-10-CM

## 2024-03-21 DIAGNOSIS — M25472 Effusion, left ankle: Principal | ICD-10-CM

## 2024-03-21 DIAGNOSIS — D508 Other iron deficiency anemias: Principal | ICD-10-CM

## 2024-03-21 DIAGNOSIS — R0683 Snoring: Principal | ICD-10-CM

## 2024-03-21 DIAGNOSIS — N83202 Unspecified ovarian cyst, left side: Principal | ICD-10-CM

## 2024-03-21 DIAGNOSIS — M79642 Pain in left hand: Principal | ICD-10-CM

## 2024-03-21 DIAGNOSIS — R5383 Other fatigue: Principal | ICD-10-CM

## 2024-03-21 DIAGNOSIS — M79641 Pain in right hand: Principal | ICD-10-CM

## 2024-03-24 DIAGNOSIS — E0590 Thyrotoxicosis, unspecified without thyrotoxic crisis or storm: Principal | ICD-10-CM

## 2024-03-24 DIAGNOSIS — R059 Cough, unspecified type: Principal | ICD-10-CM

## 2024-03-27 DIAGNOSIS — E0590 Thyrotoxicosis, unspecified without thyrotoxic crisis or storm: Principal | ICD-10-CM

## 2024-03-27 MED ORDER — METHIMAZOLE 10 MG TABLET
ORAL_TABLET | Freq: Two times a day (BID) | ORAL | 3 refills | 90.00000 days | Status: CP
Start: 2024-03-27 — End: 2025-03-27

## 2024-03-29 MED ORDER — IBUPROFEN 600 MG TABLET
ORAL_TABLET | Freq: Four times a day (QID) | ORAL | 1 refills | 30.00000 days | PRN
Start: 2024-03-29 — End: ?

## 2024-04-03 DIAGNOSIS — E0590 Thyrotoxicosis, unspecified without thyrotoxic crisis or storm: Principal | ICD-10-CM

## 2024-04-03 MED ORDER — METHIMAZOLE 10 MG TABLET
ORAL_TABLET | Freq: Two times a day (BID) | ORAL | 3 refills | 90.00000 days | Status: CP
Start: 2024-04-03 — End: 2025-04-03

## 2024-04-14 ENCOUNTER — Emergency Department
Admit: 2024-04-14 | Discharge: 2024-04-14 | Disposition: A | Discharge status: Home / Self Care | Payer: Medicaid (Managed Care)

## 2024-04-14 DIAGNOSIS — K5900 Constipation, unspecified: Principal | ICD-10-CM

## 2024-04-14 DIAGNOSIS — K047 Periapical abscess without sinus: Principal | ICD-10-CM

## 2024-04-14 MED ORDER — PEG 3350-ELECTROLYTES 236 GRAM-22.74 GRAM-6.74 GRAM-5.86 GRAM SOLUTION
Freq: Once | ORAL | 0 refills | 1.00000 days | Status: CP
Start: 2024-04-14 — End: 2024-04-14

## 2024-04-19 ENCOUNTER — Inpatient Hospital Stay: Admit: 2024-04-19 | Discharge: 2024-04-19 | Payer: Medicaid (Managed Care)

## 2024-04-19 ENCOUNTER — Ambulatory Visit: Admit: 2024-04-19 | Discharge: 2024-04-19 | Payer: Medicaid (Managed Care)

## 2024-04-19 ENCOUNTER — Ambulatory Visit
Admit: 2024-04-19 | Discharge: 2024-04-19 | Payer: Medicaid (Managed Care) | Attending: Student in an Organized Health Care Education/Training Program | Primary: Student in an Organized Health Care Education/Training Program

## 2024-04-19 DIAGNOSIS — H3412 Central retinal artery occlusion, left eye: Principal | ICD-10-CM

## 2024-04-19 DIAGNOSIS — I639 Cerebral infarction, unspecified: Principal | ICD-10-CM

## 2024-04-19 DIAGNOSIS — E0500 Thyrotoxicosis with diffuse goiter without thyrotoxic crisis or storm: Principal | ICD-10-CM

## 2024-04-24 ENCOUNTER — Ambulatory Visit: Admit: 2024-04-24 | Payer: Medicaid (Managed Care) | Attending: Audiologist | Primary: Audiologist

## 2024-04-27 ENCOUNTER — Inpatient Hospital Stay: Admit: 2024-04-27 | Discharge: 2024-04-27 | Payer: Medicaid (Managed Care)

## 2024-05-02 DIAGNOSIS — I10 Essential (primary) hypertension: Principal | ICD-10-CM

## 2024-05-02 DIAGNOSIS — R000 Tachycardia, unspecified: Principal | ICD-10-CM

## 2024-05-02 DIAGNOSIS — E0590 Thyrotoxicosis, unspecified without thyrotoxic crisis or storm: Principal | ICD-10-CM

## 2024-05-02 DIAGNOSIS — R0602 Shortness of breath: Principal | ICD-10-CM

## 2024-05-02 DIAGNOSIS — I6522 Occlusion and stenosis of left carotid artery: Principal | ICD-10-CM

## 2024-05-02 MED ORDER — PROPRANOLOL ER 60 MG CAPSULE,24 HR,EXTENDED RELEASE
ORAL_CAPSULE | Freq: Every day | ORAL | 5 refills | 30.00000 days | Status: CP
Start: 2024-05-02 — End: 2025-06-06

## 2024-05-09 ENCOUNTER — Inpatient Hospital Stay: Admit: 2024-05-09 | Discharge: 2024-05-10 | Payer: Medicaid (Managed Care)

## 2024-05-16 DIAGNOSIS — I10 Essential (primary) hypertension: Principal | ICD-10-CM

## 2024-05-16 DIAGNOSIS — E0590 Thyrotoxicosis, unspecified without thyrotoxic crisis or storm: Principal | ICD-10-CM

## 2024-05-16 DIAGNOSIS — G4700 Insomnia, unspecified: Principal | ICD-10-CM

## 2024-05-16 DIAGNOSIS — K5900 Constipation, unspecified: Principal | ICD-10-CM

## 2024-05-18 DIAGNOSIS — R000 Tachycardia, unspecified: Principal | ICD-10-CM

## 2024-06-01 ENCOUNTER — Encounter: Admit: 2024-06-01 | Discharge: 2024-06-01 | Payer: Medicaid (Managed Care)

## 2024-06-01 ENCOUNTER — Ambulatory Visit: Admit: 2024-06-01 | Discharge: 2024-06-01 | Payer: Medicaid (Managed Care)

## 2024-06-01 DIAGNOSIS — M1710 Unilateral primary osteoarthritis, unspecified knee: Principal | ICD-10-CM

## 2024-06-07 ENCOUNTER — Inpatient Hospital Stay: Admit: 2024-06-07 | Discharge: 2024-06-08 | Payer: Medicaid (Managed Care)

## 2024-06-20 MED ORDER — PROPRANOLOL ER 60 MG CAPSULE,24 HR,EXTENDED RELEASE
ORAL_CAPSULE | Freq: Every day | ORAL | 1 refills | 90.00000 days | Status: CP
Start: 2024-06-20 — End: ?

## 2024-06-26 DIAGNOSIS — I10 Essential (primary) hypertension: Principal | ICD-10-CM

## 2024-06-26 DIAGNOSIS — E041 Nontoxic single thyroid nodule: Principal | ICD-10-CM

## 2024-06-26 DIAGNOSIS — R238 Other skin changes: Principal | ICD-10-CM

## 2024-06-26 DIAGNOSIS — E0500 Thyrotoxicosis with diffuse goiter without thyrotoxic crisis or storm: Principal | ICD-10-CM

## 2024-06-26 MED ORDER — NAPROXEN 500 MG TABLET
ORAL_TABLET | Freq: Two times a day (BID) | ORAL | 2 refills | 30.00000 days | Status: CP
Start: 2024-06-26 — End: 2025-06-26

## 2024-06-28 ENCOUNTER — Ambulatory Visit: Admit: 2024-06-28 | Payer: Medicaid (Managed Care)

## 2024-06-30 DIAGNOSIS — H9319 Tinnitus, unspecified ear: Principal | ICD-10-CM

## 2024-07-06 ENCOUNTER — Inpatient Hospital Stay: Admit: 2024-07-06 | Discharge: 2024-07-07 | Payer: Medicaid (Managed Care) | Attending: Neurology | Primary: Neurology

## 2024-07-11 MED ORDER — METRONIDAZOLE 500 MG TABLET
ORAL_TABLET | Freq: Two times a day (BID) | ORAL | 0 refills | 7.00000 days | Status: CP
Start: 2024-07-11 — End: 2024-07-18

## 2024-07-18 DIAGNOSIS — L301 Dyshidrosis [pompholyx]: Principal | ICD-10-CM

## 2024-07-18 MED ORDER — TACROLIMUS 0.1 % TOPICAL OINTMENT
TOPICAL | 5 refills | 0.00000 days | Status: CP
Start: 2024-07-18 — End: ?

## 2024-07-18 MED ORDER — CLOBETASOL 0.05 % TOPICAL OINTMENT
Freq: Two times a day (BID) | TOPICAL | 1 refills | 0.00000 days | Status: CP
Start: 2024-07-18 — End: 2025-07-18

## 2024-07-19 ENCOUNTER — Ambulatory Visit: Admit: 2024-07-19 | Discharge: 2024-07-20 | Payer: Medicaid (Managed Care)

## 2024-07-19 DIAGNOSIS — H5789 Other specified disorders of eye and adnexa: Principal | ICD-10-CM

## 2024-07-19 DIAGNOSIS — I639 Cerebral infarction, unspecified: Principal | ICD-10-CM

## 2024-07-19 DIAGNOSIS — E0500 Thyrotoxicosis with diffuse goiter without thyrotoxic crisis or storm: Principal | ICD-10-CM

## 2024-07-19 DIAGNOSIS — E042 Nontoxic multinodular goiter: Principal | ICD-10-CM

## 2024-07-19 DIAGNOSIS — E079 Disorder of thyroid, unspecified: Principal | ICD-10-CM

## 2024-07-20 DIAGNOSIS — E0590 Thyrotoxicosis, unspecified without thyrotoxic crisis or storm: Principal | ICD-10-CM

## 2024-07-20 DIAGNOSIS — E0500 Thyrotoxicosis with diffuse goiter without thyrotoxic crisis or storm: Principal | ICD-10-CM

## 2024-07-20 MED ORDER — METHIMAZOLE 10 MG TABLET
ORAL_TABLET | Freq: Two times a day (BID) | ORAL | 3 refills | 90.00000 days | Status: CP
Start: 2024-07-20 — End: 2025-07-20

## 2024-08-02 MED ORDER — DICLOFENAC SODIUM 75 MG TABLET,DELAYED RELEASE
ORAL_TABLET | Freq: Two times a day (BID) | ORAL | 2 refills | 0.00000 days
Start: 2024-08-02 — End: ?

## 2024-08-04 DIAGNOSIS — L501 Idiopathic urticaria: Principal | ICD-10-CM

## 2024-08-04 MED ORDER — XOLAIR 300 MG/2 ML SUBCUTANEOUS SYRINGE
SUBCUTANEOUS | 11 refills | 56.00000 days | Status: CP
Start: 2024-08-04 — End: ?
  Filled 2024-09-19: qty 2, 28d supply, fill #0

## 2024-08-04 NOTE — Progress Notes (Unsigned)
 Xolair  PA approved. Rx sent to [facility]

## 2024-08-08 ENCOUNTER — Ambulatory Visit: Admit: 2024-08-08 | Discharge: 2024-08-08 | Payer: Medicaid (Managed Care)

## 2024-08-08 ENCOUNTER — Ambulatory Visit: Admit: 2024-08-08 | Discharge: 2024-08-08 | Payer: Worker's Compensation | Attending: Audiologist | Primary: Audiologist

## 2024-08-08 ENCOUNTER — Ambulatory Visit
Admit: 2024-08-08 | Discharge: 2024-08-08 | Payer: Medicaid (Managed Care) | Attending: Adult Medicine | Primary: Adult Medicine

## 2024-08-08 DIAGNOSIS — M5442 Lumbago with sciatica, left side: Principal | ICD-10-CM

## 2024-08-08 DIAGNOSIS — H9319 Tinnitus, unspecified ear: Principal | ICD-10-CM

## 2024-08-08 DIAGNOSIS — E042 Nontoxic multinodular goiter: Principal | ICD-10-CM

## 2024-08-08 DIAGNOSIS — G8929 Other chronic pain: Principal | ICD-10-CM

## 2024-08-08 DIAGNOSIS — M5441 Lumbago with sciatica, right side: Principal | ICD-10-CM

## 2024-08-08 DIAGNOSIS — M5416 Radiculopathy, lumbar region: Principal | ICD-10-CM

## 2024-08-08 DIAGNOSIS — J3489 Other specified disorders of nose and nasal sinuses: Principal | ICD-10-CM

## 2024-08-08 DIAGNOSIS — Z9109 Other allergy status, other than to drugs and biological substances: Principal | ICD-10-CM

## 2024-08-08 DIAGNOSIS — J329 Chronic sinusitis, unspecified: Principal | ICD-10-CM

## 2024-08-08 DIAGNOSIS — J309 Allergic rhinitis, unspecified: Principal | ICD-10-CM

## 2024-08-08 MED ORDER — SODIUM CHLORIDE 0.65 % NASAL SPRAY AEROSOL
Freq: Four times a day (QID) | NASAL | 11 refills | 32.00000 days | Status: CP | PRN
Start: 2024-08-08 — End: 2025-08-08

## 2024-08-08 MED ORDER — AZELASTINE 137 MCG (0.1 %) NASAL SPRAY
Freq: Two times a day (BID) | NASAL | 11 refills | 55.00000 days | Status: CP
Start: 2024-08-08 — End: 2025-08-08

## 2024-08-08 MED ORDER — FLUTICASONE PROPIONATE 50 MCG/ACTUATION NASAL SPRAY,SUSPENSION
Freq: Two times a day (BID) | NASAL | 11 refills | 60.00000 days | Status: CP
Start: 2024-08-08 — End: ?

## 2024-08-08 NOTE — Patient Instructions (Signed)
 [facility]Med Sinus Rinse Instructions             Dr. Lenise has recommended that you use the [employer]Med sinus rinse, which is a way to clean our your nose and sinuses. This will help alleviate your nasal and sinus symptoms caused by allergies, infection, or surgery.     To use, fill the bottle with distilled water (do not use tap water or well water) and add the saline packet. Lukewarm or body temperature water is best. The bottle we give you has one saline packet with it, so you will need to buy additional packets.     Lean your head forward over the sink and gently squeeze the bottle into one side of your nose. Keep alternating nostrils until the bottle is empty. The rinse usually goes into one side of your nose and come out of the other side, but if it just goes in and out of the same nostril that's ok too. Some people like to do the rinses in the shower.     Wait about 10 to 15 minutes for the nasal draining to stop before using any prescribed nasal sprays.             NASAL STEROID USE INSTRUCTIONS    Step 1. Prepare the nose. Blow the nose before administering the drug.    Step 2. Prime and activate the delivery device as recommended by the manufacturer.    Step 3. Position the head by tilting the head forward.    Step 4. Insert the tip of the applicator gently, avoiding contact with the septum.    Step 5. Aim the applicator tip about 45?? from the floor of the nose and direct it at the outer corner of the eye on the same side to avoid traumatizing or spraying the septum.    Step 6. Close the other nostril gently with a finger.     Step 7. Sniff or inhale gently while delivering the drug.

## 2024-08-08 NOTE — Progress Notes (Signed)
 ATTENDING ATTEST ION:  I discussed the findings, assessment and plan with the Resident and agree with the Resident's findings and plan as documented in the Resident's note. I was immediately available.  Marshelle Bilger. CANDIE Ronal Overcast MD, MPH, [place], [place]

## 2024-08-08 NOTE — Progress Notes (Signed)
 [facility]  Audiology at [facility]    AUDIOLOGIC EVALUATION REPORT     Patient: Carrie Bush, Carrie Bush  MRN: [id number]  DOB: [birth year]  DATE OF EVALUATION: 08/08/2024    [employer]: Please see Audiogram with full report under MEDIA tab  HISTORY     Carrie Bush is a 43 y.o. individual seen by Audiology for a hearing evaluation as part of Dr. Armin Fire 's ENT clinic. Today, patient reports about a one year hx of ringing and whooshing intermittent tinnitus L>R. She also notes fullness in her left ear. Pt had ear infections as a child.    RESULTS     Otoscopy revealed:  Right Ear: clear external auditory canal  Left Ear: clear external auditory canal    Tympanometry using a 226 Hz probe tone was consistent with:  Right Ear: Type A tympanogram, consistent with normal middle ear pressure, compliance, and volume  Left Ear: Type A tympanogram, consistent with normal middle ear pressure, compliance, and volume    Today's behavioral evaluation was completed using conventional audiometry via insert earphones with good reliability.     Right Ear: Hearing within normal limits from [phone] Hz  Speech Recognition Threshold (SRT): 20 dB HL  Word Recognition Testing: 100% at 60 dB HL using recorded NU-6 word list.    Left Ear: Hearing within normal limits from [phone] Hz  Speech Recognition Threshold (SRT): 15 dB HL  Word Recognition Testing: 100% at 60 dB HL using recorded NU-6 word list.    Patient was counseled on today's results and expressed understanding.     RECOMMENDATIONS     Re-evaluate hearing per ENT request, sooner should concerns arise    Barnie Crazier, Au.D.    Access your Audiogram via MyChart:  - Log into myChart: Menu > Document Center > Medical Record Requests  - Click the hyperlink 'Request Medical Records'   - Fill out fields + Select Audiograms option under 'Specific Diagnostic Images' > Submit    Charges associated with this visit:  HC TYMPANOMETRY  HC PURE TONE AUDIO AIR ONLY  HC SPEECH AUDIOMETRY THRSH & SPEECH RECOG

## 2024-08-08 NOTE — Progress Notes (Signed)
 [facility] Specialty Medication Onboarding    Specialty Medication: Xolair   Prior Authorization: Not Required   Financial Assistance: No - copay  <$25  Copay/Day Supply: $4 / 28    Insurance Restrictions: None     Notes to Pharmacist: None  Credit Card on File: no  Delivery Method (based on home address currently on file): UPS no restrictions      The triage team has completed the benefits investigation and has determined that the patient is able to fill this medication at [facility]. Please contact the patient to complete the onboarding or follow up with the prescribing physician as needed.

## 2024-08-08 NOTE — Progress Notes (Signed)
 ORTHOPAEDIC SPINE CLINIC NOTE       Carrie Bush A Carrie Bourget, FNP  [email or web address]  [phone]        Patient Name:Carrie Bush  MRN: [id number]  DOB: 1981-05-21    Date: 08/08/2024    PCP: Carrie Bush, CNM    ASSESSMENT:     1. Lumbar radiculopathy    2. Chronic bilateral low back pain with bilateral sciatica        [name] is a 43 y.o. female with chronic low back pain and bilateral lumbar radiculopathy.      PROM:  Promis Cat V1.1 - Pain Interference    08/08/2024  9:30 AM EDT - Filed by Patient   PROMIS Pain Interference T-Score (range: 10 - 90) (range: 10 - 90)  71 (severe) !!      Promis Cat V2.0 - Physical Function    08/08/2024  9:31 AM EDT - Filed by Patient   PROMIS Physical Function T-Score (range: 10 - 90)  29 (severe dysfunction) !!           PLAN and RECOMMENDATIONS:     Options reviewed with patient.  Recommended [facility] and follow up with Carrie Bush for possible injection options if symptoms continue.    Future Considerations:  - ESI    Patient Instructions   Medications: Take over-the-counter medications as needed and as tolerated  Activity: Activities as tolerated.  Conservative Care: We referred you to Physical Therapy.  Recommend you buy Treat Your Own Back by Carrie Bush (available on [email or web address], paperback, $10).  You can find a Carrie Bush at ... [email or web address]  Surgical Care: No surgical indications at this time.  Imaging studies: none  Labs: None  Return in about 4 weeks (around 09/05/2024) for follow up with Carrie Bush to discuss injection options.     Contact our nursing team via MyChart or telephone with any clinical questions/concerns.  Our scheduling team and support staff can be reached at [phone].     Orders Placed This Encounter   Procedures    Ambulatory referral to Physical Therapy          SUBJECTIVE:     Chief Complaint:  Chief Complaint   Patient presents with    Consult       History of Present Illness:        08/08/24 1120   PainSc: 4      Carrie Bush is a 43 y.o. female with a relevant PMH of ischemic stroke seen in consultation at the request of Pcp, None Per Patient for evaluation of low back pain, left leg pain, numbness to BLE, right leg pain.  Date of onset: since 07/2024.     History of Present Illness  Carrie Bush is a 43 year old female who presents with worsening bilateral leg numbness and tingling.    She has been experiencing numbness and tingling in both legs since a knee injury in 2024. The symptoms include a tingling, burning pain sensation in her left calf .  She reports she also has low back pain with symptoms of radiating pain and numbness to her right calf. The symptoms have progressively worsened, with significant deterioration noted in November 2024, again worsening in January 2025, and again in April 2025. She reports that the numbness and pain makes it where she is unable to stand or walk for more than five minutes, and notes that sitting alleviates the symptoms while activity exacerbates them.    She has not  undergone physical therapy for her back and has tried heat and ice on her knee but not on her back. She is not currently attending a pain clinic. Her current medications include naproxen  or ibuprofen  for pain, and she previously used tramadol , which is now expired. She has tried gabapentin in the past but discontinued it due to concerns for it causing dementia, and reports that she is not interested in starting this back. She also used Flexeril  in the early stages of her injury but has not taken it recently.    She reports a significant weight loss of 60 pounds since April 2025, attributed to a hyperactive thyroid. No loss of bowel or bladder control, but she mentions frequent falls due to her condition.    Her family history includes her mother receiving spinal injections for pain relief and her father having had ALS. She has undergone an MRI.      Current Treatments Previous Treatments   Current Relevant Pain Medications:  Naproxen   Tramadol     Current Physical Therapy: No recent PT    Adjunct Treatments: Activity Modification and Heat/Ice    In a Pain Clinic: No Prior Relevant Pain Medications:  -Flexeril     Prior Physical Therapy: None.    Injections:None.    Prior Relevant Surgeries: None.        Medical History   She  has a past medical history of Acid reflux, Allergic rhinitis (01/11/2020), Anemia, Anisocoria (12/21/2020), Anxiety, Aphasia as late effect of stroke, Arthritis, Asthma (HHS-HCC), Atopic dermatitis, Autoimmune disease (HHS-HCC), Blind left eye, Brain concussion, Carotid artery occlusion, Dental caries, Depression, Disease of thyroid gland (2023 and present), Dry eyes, Ganglion cyst, Headache (12/23/2020), HLD (hyperlipidemia), Hypertension, Ischemic stroke    (CMS-HCC) (12/21/2020), Neuromuscular disorder (CMS-HCC), Obesity, Patient denies medical problems, Peripheral neuropathy, TIA (transient ischemic attack), TMJ dysfunction, Tobacco abuse, Tooth sensitivity, and Urticaria.     Surgical History   She  has a past surgical history that includes Ear tube removal; Tonsilectomy, adenoidectomy, bilateral myringotomy and tubes; pr myomectomy 5/>,tot>250 gms,abd apprch (Midline, 09/18/2022); Tympanostomy tube placement (1994); Adenoidectomy (1986); and Tonsillectomy (1986).     Allergies   Montelukast , Permethrin, Atorvastatin , Rosuvastatin , Simvastatin , and Sulfa (sulfonamide antibiotics)   Medications   She has a current medication list which includes the following prescription(s): acetaminophen , azelastine , betamethasone  dipropionate, cetirizine , clobetasol , clobetasol , clotrimazole -betamethasone , cyclobenzaprine , cyclobenzaprine , empty container, epinephrine , fluticasone  propionate, furosemide , hydroxyzine , ibuprofen , methimazole , metronidazole , naproxen , xolair , pravastatin , propranolol , semaglutide  (weight loss), semaglutide  (weight loss), sodium chloride , tacrolimus, triamcinolone , and triamcinolone .   Review of Systems A 10-system review was performed by questionnaire and noted in the electronic chart.  Positives noted/discussed.  Balance of systems was negative.    Fever/chills: denies  Recent unexplained weight loss: denies  Bowel/bladder symptoms: denies   Family History Her family history includes ALS (age of onset: 86 - 61) in her father; Alcohol abuse in her maternal grandfather; Allergic rhinitis in her mother; Aneurysm in her maternal aunt and maternal aunt; Arthritis in her mother; Asthma in her father; Cancer in her maternal grandfather and mother; Diabetes in her father, mother, sister, and sister; Diabetes (age of onset: 73) in her sister; Early death in her sister and sister; GER disease in her mother; Glaucoma in her maternal aunt and maternal aunt; Heart disease in her mother, sister, and sister; Heart disease (age of onset: 16 - 67) in her maternal aunt; Heart failure in her sister; Hypertension in her maternal aunt, maternal aunt, mother, sister, and sister; Kidney disease in her  sister and sister; Lupus in her maternal grandmother; Sarcoidosis in her mother; Stroke in her maternal aunt, maternal aunt, sister, and sister; Thyroid disease in her mother.     Social History She  reports that she has quit smoking. Her smoking use included cigarettes. She has a 1.6 pack-year smoking history. She has been exposed to tobacco smoke. She has never used smokeless tobacco. She reports that she does not currently use alcohol after a past usage of about 3.0 standard drinks of alcohol per week. She reports that she does not use drugs.        Occupational History    Not on file          OBJECTIVE:     PHYSICAL EXAM:  Vitals: Temp 36.6 ??C (97.9 ??F) (Temporal)  - Ht 167.6 cm (5' 6)  - Wt 77.9 kg (171 lb 11.2 oz)  - BMI 27.71 kg/m??   Appearance: well-nourished and no acute distress   Skin: No cyanosis or clubbing in bilat hands. and No edema in the feet.  Pulses: 2+ DP pulses bilaterally     Neurologic exam:   Mental Status: Carrie Bush is oriented to time, place, and person & is alert and cooperative  Motor: Normal bulk and tone without evidence of pronator drift or fasciculations. No abnormal movements.  Gait: stooped gait.    Cerebellum/Coordination: Deferred    Motor R L  Reflexes R L   IP 5 5  Patellar 2+ 2+   Quad 5 5  Achilles 2+ 2+       Pathologic R L   TA 5 5  Hoffmann's neg. neg.   EHL 4 4  Clonus neg. neg.   GS 4 4  Babinski neg. neg.     Sensory: Sensation to light touch intact throughout.    Thoracolumbar Spine Exam:  Inspection: No swelling, erythema, deformity, atrophy or hypertrophy noted  Lumbar Spine ROM: mildly restricted  Spine Palpation: normal skin, TTP in lower lumbar region    Facet Loading Test: Positive Bilaterally  Nerve Tension Signs: Lhermitte's negative and Slump test negative    Sacroiliac Joint:   Inspection: Normal alignment. No erythema, discoloration, or asymmetry.  Fortin Finger's Test: Deferred  Palpation: No tenderness at PSIS    Right Hip/Knee:   No pain with hip internal rotation/loading. Normal internal rotation ROM.   No knee pain with flexion/extension. No atrophy noted upon inspection. Normal skin. Normal ROM and stability.  Left Hip/Knee:   No pain with hip internal rotation/loading. Normal internal rotation ROM.   No knee pain with flexion/extension. No atrophy noted upon inspection. Normal skin. Normal ROM and stability.         MEDICAL DECISION MAKING    Test Results:  Imaging:   MRI lumbar spine 04/2024 at [facility].  Impression: Multilevel DDD and facet arthropathy. severe right L4-L5 and bilateral L5-S1 neural foraminal stenosis    EMG 06/2024 at [facility] abnormal study with reduced peroneal CMAP amplitudes and mild left TA active denervation. Though reduced fibular CMAP amplitudes alone are non-specific, they could indicate L5 radiculopathies, especially given active left TA denervation, albeit mild, and without associated chronic reinnervation. Certainly there is no evidence for diffuse denervation    I personally interpreted the images. Images were reviewed with the patient on the PACS monitor. The available reports were reviewed.    Labs:  Lab Results   Component Value Date    A1C 5.4 02/18/2023       Discussion:  Clinical findings, diagnostic/treatment options, and plan were discussed with the patient.  Activities - Advised activities as tolerated, using pain as a guide.      E&M Coding:    MEDICAL DECISION MAKING (level of service defined by 2/3 elements)     Number/Complexity of Problems Addressed 1 or more chronic illnesses with exacerbation, progression, or side effects of treatment (99204/99214)   Amount/Complexity of Data to be Reviewed/Analyzed Independent interpretation of a test performed by another physician/other qualified health care professional (99204/99214)   Risk of Complications/Morbidity/Mortality of Management Over-the-counter Medications (99203/99213)  Physical Therapy/Occupational Therapy (99203/99213)     Or TIME     Total Time for E/M Services on the Date of Encounter N/A          cc: Pcp, None Per Patient, Carrie Bush, CNM

## 2024-08-08 NOTE — Patient Instructions (Addendum)
 Medications: Take over-the-counter medications as needed and as tolerated  Activity: Activities as tolerated.  Conservative Care: We referred you to Physical Therapy.  Recommend you buy Treat Your Own Back by Grayce Clara (available on amazon.com, paperback, $10).  You can find a [employer] at ... [email or web address]  Surgical Care: No surgical indications at this time.  Imaging studies: none  Labs: None  Return in about 4 weeks (around 09/05/2024) for follow up with PM&R to discuss injection options.     Contact our nursing team via MyChart or telephone with any clinical questions/concerns.  Our scheduling team and support staff can be reached at [phone].
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
## 2024-08-11 MED ORDER — PREGABALIN 25 MG CAPSULE
ORAL_CAPSULE | Freq: Every evening | ORAL | 0 refills | 21.00000 days | Status: CP
Start: 2024-08-11 — End: 2024-09-01

## 2024-08-15 NOTE — Progress Notes (Unsigned)
 The [facility] has made a third and final attempt to reach this patient to refill the following medication: Xolair .      We have left voicemails on the following phone numbers: [phone], have sent a MyChart message, and have sent a text message to the following phone numbers: [phone].    Dates contacted: 11/4, 11/11, and 11/18  Last scheduled delivery: n/a - Xolair  requires onboarding    The patient may be at risk of non-compliance with this medication. The patient should call the [facility] at [phone]  Option 4, then Option 3: Allergy, Immunology, Pulmonary, Neurology to refill medication.    Shelba DELENA Hummer, PharmD   [facility] Specialty Pharmacist syringe:  Gather all supplies needed for injection on a clean, flat working surface: medication syringe(s) removed from packaging, alcohol swab, sharps container, etc.  Look at the medication label - look for correct medication, correct dose, and check the expiration date  Look at the medication - the liquid in the syringe should appear clear and colorless to slightly yellow, you may see a few white particles  Lay the syringe on a flat surface and allow it to warm up to room temperature for at least 15-30 minutes  Select injection site - you can use the front of your thigh or your belly (but not the area 2 inches around your belly button); if someone else is giving you the injection you can also use your upper arm in the skin covering your triceps muscle or in the buttocks  Prepare injection site - wash your hands and clean the skin at the injection site with an alcohol swab and let it air dry, do not touch the injection site again before the injection  Pull off the needle safety cap, do not remove until immediately prior to injection  Pinch the skin - with your hand not holding the syringe pinch up a fold of skin at the injection site using your forefinger and thumb  Insert the needle into the fold of skin at about a 45 degree angle - it's best to use a quick dart-like motion  Push the plunger down slowly as far as it will go until the syringe is empty, if the plunger is not fully depressed the needle shield will not extend to cover the needle when it is removed, hold the syringe in place for a full 5 seconds  Check that the syringe is empty and keep pressing down on the plunger while you pull the needle out at the same angle as inserted; after the needle is removed completely from the skin, release the plunger allowing the needle shield to activate and cover the used needle  Dispose of the used syringe immediately in your sharps disposal container, do not attempt to recap the needle prior to disposing  If you see any blood at the injection site, press a cotton ball or gauze on the site and maintain pressure until the bleeding stops, do not rub the injection site    Xolair  Auto-injector Pen:  Before you use Xolair  autoinjector for the first time, make sure your healthcare provider shows you the right way to use it  Gather supplies - Xolair  autoinjector, alcohol swab, sterile cotton ball or gauze, small bandage and sharps disposal container   Look at the medication label - look for correct medication, correct dose and check the expiration date. Take note whether your dose requires more than 1 injection   Take the  carton out of the refrigerator then lay on the flat surface and allow it to warm up to room temperature for at least 30-45 minutes   Prepare injection site - wash your hands with soap and water and remove the autoinjector out of the cartoon by holding the middle part, being sure not turn the carton upside down, hold the autoinjector by the cap or remove the cap before you are ready to inject    Look at the medication - the liquid in the viewing window should appear clear and colorless to pale brownish-yellow  Choose where to inject - if you are giving yourself the injection: front of the thigh or stomach area (abdomen); if a caregiver is giving the injection: additional option of the outer area of the upper arm. Do not inject within 2-inches directly around your belly button   Clean the injection site - wipe site with alcohol swab in a circular motion and let it dry for 10 seconds, do not touch injection site again before giving injection   Remove the cap - pull cap straight off and throw away in a regular household trash. Do not clean or touch the needle guard   Give the injection - hold the autoinjector at a 90-degree angle and press straight down while holding the autoinjector firmly against the skin   Monitor the injection - keep the autoinjector in the skin when you hear the 1st click (indicates the injection has started) and monitor by watching the green indictor within the viewing window   Complete injection - listen for the 2nd click (indicates the injection is almost complete) and continue holding the autoinjector in the same position until the green indicator has stopped moving and completing fills the viewing window   Remove - lift the autoinjector straight from the skin and the needle guard will automatically extend and lock over the needle   Disposal - place the used autoinjectors in an FDA sharps disposal or use an appropriate household container (e.g closed and puncture resistant)   Care for the injection - if you see any blood at the injection site, press a cotton ball or gauze on the site and maintain pressure until the bleeding stops, do not rub the injection site  If your prescribed dose requires more than 1 injection - repeat the steps above for the next injection using a new autoinjector, make sure each injection is at least 1 inch apart from each other      Adherence/Missed dose instructions: If you miss a dose take as soon as you remember.  Resume the correct dosing schedule.        Goals of Therapy     Chronic Idiopathic Urticaria  Reduce frequency of hives and hive count   Reduce severity of itch with goal to achieve complete itch relief  Improve overall quality of life    Side Effects & Monitoring Parameters     Commonly reported side effects  Headache  Nausea, vomiting   Injection site reaction  Loss of strength and energy  Common cold symptoms, sore throat, stuffy nose  Ear pain  Painful extremities     The following side effects should be reported to the provider:  Signs of cerebrovascular disease (change in strength on one side is greater than the other, trouble speaking or thinking, change in balance or vision changes)  Signs of DVT (swelling, warmth, numbness, change in color or pain in extremities)  Signs of anaphylaxis (wheezing, chest tightness, swelling of face,  lips, tongue or throat)    Monitoring Parameters:   Anaphylactic/hypersensitivity reactions (observe patients for 2 hours after the first 3 injections and 30 minutes after subsequent injections or in accordance with individual institution policies and procedures);   Baseline serum total IgE; FEV1, peak flow, and/or other pulmonary function tests  Monitor for signs of infection    Contraindications, Warnings, & Precautions   US  Boxed Warning]: Anaphylaxis, including delayed-onset anaphylaxis, has been reported following administration; anaphylaxis may present as bronchospasm, hypotension, syncope, urticaria, and/or angioedema of the throat or tongue. Anaphylaxis has occurred after the first dose and in some cases >1 year after initiation of regular treatment. Due to the risk, patients should be observed closely for an appropriate time period after administration and should receive treatment only under direct medical supervision. Healthcare providers should be prepared to administer appropriate therapy for managing potentially life-threatening anaphylaxis. Patients should be instructed on identifying signs/symptoms of anaphylaxis and to seek immediate care if they arise.      Contraindications  Severe hypersensitivity reaction to omalizumab  or any component of the formulation    Warnings & Precautions  Cardiovascular effects: Cerebrovascular events, including transient ischemic attack and ischemic stroke, have been reported.  Eosinophilia and vasculitis: In rare cases, patients may present with systemic eosinophilia, sometimes presenting with clinical features of vasculitis.    Fever/arthralgia/rash: Reports of a constellation of symptoms including fever, arthritis or arthralgia, rash, and lymphadenopathy have been reported with post-marketing use.  Malignant neoplasms: Have been reported rarely with use in short-term studies; impact of long-term use is not known.  Parasitic infections: Use with caution and monitor patients at high risk for parasitic infections; risk of infection may be increased; appropriate duration of continued monitoring following therapy discontinuation has not been established.  Corticosteroid therapy: Gradually taper systemic or inhaled corticosteroid therapy; do not discontinue corticosteroids abruptly following initiation of omalizumab  therapy. The combined use of omalizumab  and corticosteroids in patients with chronic idiopathic urticaria has not been evaluated.  Latex: Prefilled syringe: The needle cap may contain natural rubber latex.  Appropriate use: Therapy has not been shown to alleviate acute asthma exacerbations; do not use to treat acute bronchospasm, status asthmaticus, or other allergic conditions. Do not use to treat forms of urticaria other than chronic idiopathic urticaria.  Dosing/IgE levels: Dosing for asthma is based on body weight and pretreatment total IgE serum levels. IgE levels remain elevated up to 1 year following treatment; therefore, levels taken during treatment or for up to 1 year following treatment cannot and should not be used as a dosage guide. Dosing in chronic idiopathic urticaria is not dependent on serum IgE (free or total) level or body weight.  Pregnancy Considerations: Omalizumab  is a humanized monoclonal antibody (IgG1). Potential placental transfer of human IgG is dependent upon the IgG subclass and gestational age, generally increasing as pregnancy progresses.  Breastfeeding Considerations: It is not known if omalizumab  is present in breast milk; however, IgG is excreted in human milk.  Based on information from the pregnancy exposure registry, an increased risk of adverse events was not observed in breastfed infants of mothers using omalizumab . According to the manufacturer, the decision to breastfeed during therapy should consider the risk of infant exposure, the benefits of breastfeeding to the infant, and benefits of treatment to the mother      Drug/Food Interactions Medication list reviewed in Epic. The patient was instructed to inform the care team before taking any new medications or supplements. {Blank single:19197::No drug interactions identified,***}.  Storage, Handling Precautions, & Disposal     Store this medication in the refrigerator, 2??C to 8??C (36??F to 46??F). in the original carton.  Protect from direct sunlight and do not freeze. May be removed and placed back in the refrigerator if needed; do not exceed a combined total time of 2 days out of the refrigerator.  Do not shake.  Dispose of used syringes in a sharps disposal container.      Current Medications (including OTC/herbals), Comorbidities and Allergies     Current Medications[1]    Allergies[2]    Problem List[3]    Medication list has been reviewed and updated in Epic: {Blank single:19197::Yes,***}    Allergies have been reviewed and updated in Epic: {Blank single:19197::Yes,***}    Appropriateness of Therapy     Acute infections noted within Epic:  No active infections  Patient reported infection: {Blank single:19197::None,***- patient reported to provider,***- pharmacy reported to provider}    Is the medication and dose appropriate considering the patient???s diagnosis, treatment, and disease journey, comorbidities, medical history, current medications, allergies, therapeutic goals, self-administration ability, and access barriers? {Blank single:19197::Yes,No - evidence provided by prescriber in *** note}    Prescription has been clinically reviewed: {Blank single:19197::Yes,***}      Baseline Quality of Life Assessment      How many days over the past month did your chronic idiopathic urticaria  keep you from your normal activities? For example, brushing your teeth or getting up in the morning. {Blank:19197::***,0,Patient declined to answer}    Financial Information     Medication Assistance provided: None Required    Anticipated copay of $4 reviewed with patient. Verified delivery address.    Delivery Information     Scheduled delivery date: ***    Expected start date: ***      Medication will be delivered via {Blank:19197::UPS,Next Day Courier,Same Day Courier,Clinic Courier - *** clinic,***} to the {Blank:19197::prescription,temporary} address in Epic WAM.  This shipment {Blank single:19197::will,will not} require a signature.      Explained the services we provide at [facility] and that each month we would call to set up refills.  Stressed importance of returning phone calls so that we could ensure they receive their medications in time each month.  Informed patient that we should be setting up refills 7-10 days prior to when they will run out of medication.  A pharmacist will reach out to perform a clinical assessment periodically.  Informed patient that a welcome packet, containing information about our pharmacy and other support services, a Notice of Privacy Practices, and a drug information handout will be sent.      The patient or caregiver noted above participated in the development of this care plan and knows that they can request review of or adjustments to the care plan at any time.      Patient or caregiver verbalized understanding of the above information as well as how to contact the pharmacy at [phone] option 4 with any questions/concerns.  The pharmacy is open Monday through Friday 8:30am-4:30pm.  A pharmacist is available 24/7 via pager to answer any clinical questions they may have.    Patient Specific Needs     Does the patient have any physical, cognitive, or cultural barriers? {Blank single:19197::No,Yes - ***}    Does the patient have adequate living arrangements? (i.e. the ability to store and take their medication appropriately) {Blank single:19197::Yes,No - ***}    Did you identify any home environmental safety or  security hazards? {Blank single:19197::No,Yes - ***}    Patient prefers to have medications discussed with  {Blank single:19197::Patient,Family Member,Caregiver,Other}     Is the patient or caregiver able to read and understand education materials at a high school level or above? {Blank single:19197::No,Yes}    Patient's primary language is  {Blank single:19197::English,Spanish,***}     Is the patient high risk? {sschighriskpts:78327}    Does the patient have an additional or emergency contact listed in their chart? {Blank single:19197::Yes,No, patient refused.,***}    SOCIAL DETERMINANTS OF HEALTH     At the [facility], we have learned that life circumstances - like trouble affording food, housing, utilities, or transportation can affect the health of many of our patients.   That is why we wanted to ask: are you currently experiencing any life circumstances that are negatively impacting your health and/or quality of life? {YES/NO/PATIENTDECLINED:93004}    Social Drivers of Health     Food Insecurity: Food Insecurity Present (02/11/2024)    Hunger Vital Sign     Worried About Running Out of Food in the Last Year: Often true     Ran Out of Food in the Last Year: Often true   Tobacco Use: Medium Risk (08/08/2024)    Patient History     Smoking Tobacco Use: Former     Smokeless Tobacco Use: Never     Passive Exposure: Past   Transportation Needs: Unmet Transportation Needs (02/11/2024)    PRAPARE - Therapist, Art (Medical): Yes     Lack of Transportation (Non-Medical): Yes   Alcohol Use: Not on file   Housing: High Risk (02/11/2024)    Housing     Within the past 12 months, have you ever stayed: outside, in a car, in a tent, in an overnight shelter, or temporarily in someone else's home (i.e. couch-surfing)?: Yes     Are you worried about losing your housing?: Yes   Physical Activity: Not on file   Utilities: High Risk (02/11/2024)    Utilities     Within the past 12 months, have you been unable to get utilities (heat, electricity) when it was really needed?: Yes   Stress: Not on file   Interpersonal Safety: Unknown (04/14/2024)    Interpersonal Safety     Unsafe Where You Currently Live: No     Physically Hurt by Anyone: Not on file     Abused by Anyone: Not on file   Substance Use: Not on file (08/16/2023)   Intimate Partner Violence: Not At Risk (05/02/2019)    Humiliation, Afraid, Rape, and Kick questionnaire     Fear of Current or Ex-Partner: No     Emotionally Abused: No     Physically Abused: No     Sexually Abused: No   Social Connections: Unknown (05/02/2019)    Social Connection and Isolation Panel     Frequency of Communication with Friends and Family: Not on file     Frequency of Social Gatherings with Friends and Family: Not on file     Attends Religious Services: Not on file     Active Member of Clubs or Organizations: Not on file     Attends Banker Meetings: Not on file     Marital Status: Separated   Financial Resource Strain: High Risk (02/11/2024)    Overall Financial Resource Strain (CARDIA)     Difficulty of Paying Living Expenses: Very hard   Health Literacy: Low Risk  (01/16/2021)  Health Literacy     : Never   Internet Connectivity: Internet connectivity concern unknown (02/11/2024)    Internet Connectivity     Do you have access to internet services: Yes     How do you connect to the internet: Not on file     Is your internet connection strong enough for you to watch video on your device without major problems?: Not on file     Do you have enough data to get through the month?: Not on file     Does at least one of the devices have a camera that you can use for video chat?: Not on file       Would you be willing to receive help with any of the needs that you have identified today? {Yes/No/Not applicable:93005}       Shelba DELENA Hummer, PharmD  [facility] Specialty Pharmacist       [1]   Current Outpatient Medications   Medication Sig Dispense Refill    acetaminophen  (TYLENOL ) 325 MG tablet Take 2 tablets (650 mg total) by mouth every six (6) hours. 30 tablet 0    azelastine  (ASTELIN ) 137 mcg (0.1 %) nasal spray 2 sprays into each nostril two (2) times a day. 30 mL 11    betamethasone  dipropionate 0.05 % cream Apply topically two (2) times a day as needed. 135 g 1    cetirizine  (ZYRTEC ) 10 MG tablet Take 3-4 tablets (30 - 40 mg total) by mouth twice daily for chronic hives. 240 tablet 11    clobetasol  (TEMOVATE ) 0.05 % cream Apply topically two (2) times a day. To affected areas, as needed. 60 g 11    clobetasol  (TEMOVATE ) 0.05 % ointment Apply topically two (2) times a day. To rash on hands/feet when flaring 60 g 1    clotrimazole -betamethasone  (LOTRISONE ) 1-0.05 % cream       cyclobenzaprine  (FLEXERIL ) 10 MG tablet Take 1 tablet (10 mg total) by mouth nightly as needed for muscle spasms. 20 tablet 0    cyclobenzaprine  (FLEXERIL ) 10 MG tablet Take 1 tablet (10 mg total) by mouth Three (3) times a day as needed for muscle spasms. (Patient not taking: Reported on 08/08/2024) 30 tablet 0    empty container Misc Use as directed to dispose of syringes 1 each 2    EPINEPHrine  (AUVI-Q ) 0.3 mg/0.3 mL injection Inject 0.3 mL (0.3 mg total) into the muscle every five (5) minutes as needed for anaphylaxis. 2 each 4    fluticasone  propionate (FLONASE ) 50 mcg/actuation nasal spray 2 sprays into each nostril two (2) times a day. 32 g 11    furosemide  (LASIX ) 40 MG tablet Take 1 tablet (40 mg total) by mouth daily as needed. 30 tablet 9    ibuprofen  (MOTRIN ) 600 MG tablet Take 1 tablet (600 mg total) by mouth Three (3) times a day. 60 tablet 0    methIMAzole  (TAPAZOLE ) 10 MG tablet Take 1 tablet (10 mg total) by mouth Two (2) times a day (30 minutes before a meal). 180 tablet 3    naproxen  (NAPROSYN ) 500 MG tablet Take 1 tablet (500 mg total) by mouth in the morning and 1 tablet (500 mg total) in the evening. Take with meals. 60 tablet 2    omalizumab  (XOLAIR ) 300 mg/2 mL syringe Inject 2 mL (300 mg total) under the skin every twenty-eight (28) days. 4 mL 11    pravastatin  (PRAVACHOL ) 20 MG tablet Take 1 tablet (20  mg total) by mouth daily. 30 tablet 11    pregabalin (LYRICA) 25 MG capsule Take 1 capsule (25 mg total) by mouth nightly for 21 days. 21 capsule 0    propranolol  (INDERAL  LA) 60 mg 24 hr capsule Take 2 capsules (120 mg total) by mouth daily. 180 capsule 1    semaglutide , weight loss, (WEGOVY ) 0.25 mg/0.5 mL injection pen Inject 0.25 mg under the skin every seven (7) days. 2 mL 0    semaglutide , weight loss, (WEGOVY ) 0.5 mg/0.5 mL injection pen Inject 0.5 mg under the skin every seven (7) days. 2 mL 0    sodium chloride  (OCEAN) 0.65 % nasal spray 2 sprays into each nostril four (4) times a day as needed. 50 mL 11    tacrolimus (PROTOPIC) 0.1 % ointment Apply 1 gram topically to affected area of the skin twice daily when not flaring 100 g 5    triamcinolone  (KENALOG ) 0.1 % cream Apply topically two (2) times a day. 453.6 g 2    triamcinolone  (KENALOG ) 0.5 % ointment Apply topically two (2) times a day. 15 g 2     No current facility-administered medications for this visit.   [2]   Allergies  Allergen Reactions    Montelukast  Other (See Comments)     Felt like her throat was tightening up ML,CMA     Permethrin Hives, Itching and Rash    Atorvastatin       Headache     Rosuvastatin       Myalgias      Simvastatin      Sulfa (Sulfonamide Antibiotics) Rash   [3]   Patient Active Problem List  Diagnosis    Athletes foot    Chronic idiopathic urticaria    Coughing    Fluid retention    Generalized bloating    Nail fungal infection    Weight gain, abnormal    Ear pressure    Sensation of skin crawling    Nasal and sinus discharge    Skin irritation    Allergic rhinitis    Dysfunction of eustachian tube    History of ischemic stroke    Aphasia due to recent stroke    Carotid artery, internal, occlusion, left    Post-traumatic headache    Essential hypertension    Iron  deficiency anemia secondary to inadequate dietary iron  intake    Disc disease, degenerative, cervical    Fibroid uterus    Central retinal artery occlusion of left eye    Ischemic stroke    (CMS-HCC)    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

## 2024-08-16 MED ORDER — DICLOFENAC SODIUM 75 MG TABLET,DELAYED RELEASE
ORAL_TABLET | Freq: Two times a day (BID) | ORAL | 2 refills | 30.00000 days
Start: 2024-08-16 — End: ?
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
## 2024-09-14 NOTE — Telephone Encounter (Signed)
 [facility]: Schedule follow up with [facility] per mychart request

## 2024-09-15 NOTE — Telephone Encounter (Signed)
 [facility]: Schedule follow up with [facility] per mychart request

## 2024-09-18 MED ORDER — NYSTATIN 100,000 UNIT/ML ORAL SUSPENSION
Freq: Four times a day (QID) | ORAL | 0 refills | 3.00000 days | Status: CP
Start: 2024-09-18 — End: 2024-09-25

## 2024-09-18 NOTE — Progress Notes (Signed)
 [facility]    Patient Onboarding/Medication Counseling    Carrie Bush states last dose of Xolair  was ~1 month ago and is currently having flares on fingers and arm.      Carrie Bush is a 43 y.o. female with chronic idiopathic urticaria who I am counseling today on continuation of therapy.  I am speaking to the patient.    Was a nurse, learning disability used for this call? No    Verified patient's date of birth / HIPAA.    Specialty medication(s) to be sent: Inflammatory Disorders: Xolair       Non-specialty medications/supplies to be sent: n/a      Medications not needed at this time: sharps container       The patient declined counseling on medication administration, missed dose instructions, goals of therapy, side effects and monitoring parameters, warnings and precautions, drug/food interactions, and storage, handling precautions, and disposal because they have taken the medication previously. The information in the declined sections below are for informational purposes only and was not discussed with patient.     Xolair  Syringes (omalizumab )    Medication & Administration     Dosage: Inject 300 mg under the skin every 28 days    Administration:     Vials: Must be administered as a subcutaneous injection in the abdomen or thighs by a healthcare professional. Patient will be observed after receiving first 3 injections in clinic before moving to home administration.    Prefilled Syringe:  Home administration screening questions:    Has patient ever had an anaphylaxis reaction to Xolair  or other agents, such as foods, drugs, biologics, etc? No    Has patient received at least 3 doses in clinic under the supervision of a healthcare proefssional? Yes    Does the patient have an Epi-pen to use for possible anaphylaxis reaction? Yes    Injection administration:      Xolair  pre-filled syringe:  Gather all supplies needed for injection on a clean, flat working surface: medication syringe(s) removed from packaging, alcohol swab, sharps container, etc.  Look at the medication label - look for correct medication, correct dose, and check the expiration date  Look at the medication - the liquid in the syringe should appear clear and colorless to slightly yellow, you may see a few white particles  Lay the syringe on a flat surface and allow it to warm up to room temperature for at least 15-30 minutes  Select injection site - you can use the front of your thigh or your belly (but not the area 2 inches around your belly button); if someone else is giving you the injection you can also use your upper arm in the skin covering your triceps muscle or in the buttocks  Prepare injection site - wash your hands and clean the skin at the injection site with an alcohol swab and let it air dry, do not touch the injection site again before the injection  Pull off the needle safety cap, do not remove until immediately prior to injection  Pinch the skin - with your hand not holding the syringe pinch up a fold of skin at the injection site using your forefinger and thumb  Insert the needle into the fold of skin at about a 45 degree angle - it's best to use a quick dart-like motion  Push the plunger down slowly as far as it will go until the syringe is empty, if the plunger is not fully depressed the needle shield  will not extend to cover the needle when it is removed, hold the syringe in place for a full 5 seconds  Check that the syringe is empty and keep pressing down on the plunger while you pull the needle out at the same angle as inserted; after the needle is removed completely from the skin, release the plunger allowing the needle shield to activate and cover the used needle  Dispose of the used syringe immediately in your sharps disposal container, do not attempt to recap the needle prior to disposing  If you see any blood at the injection site, press a cotton ball or gauze on the site and maintain pressure until the bleeding stops, do not rub the injection site      Adherence/Missed dose instructions: If you miss a dose take as soon as you remember.  Resume the correct dosing schedule.        Goals of Therapy     Chronic Idiopathic Urticaria  Reduce frequency of hives and hive count   Reduce severity of itch with goal to achieve complete itch relief  Improve overall quality of life    Side Effects & Monitoring Parameters     Commonly reported side effects  Headache  Nausea, vomiting   Injection site reaction  Loss of strength and energy  Common cold symptoms, sore throat, stuffy nose  Ear pain  Painful extremities     The following side effects should be reported to the provider:  Signs of cerebrovascular disease (change in strength on one side is greater than the other, trouble speaking or thinking, change in balance or vision changes)  Signs of DVT (swelling, warmth, numbness, change in color or pain in extremities)  Signs of anaphylaxis (wheezing, chest tightness, swelling of face, lips, tongue or throat)    Monitoring Parameters:   Anaphylactic/hypersensitivity reactions (observe patients for 2 hours after the first 3 injections and 30 minutes after subsequent injections or in accordance with individual institution policies and procedures);   Baseline serum total IgE; FEV1, peak flow, and/or other pulmonary function tests  Monitor for signs of infection    Contraindications, Warnings, & Precautions   US  Boxed Warning]: Anaphylaxis, including delayed-onset anaphylaxis, has been reported following administration; anaphylaxis may present as bronchospasm, hypotension, syncope, urticaria, and/or angioedema of the throat or tongue. Anaphylaxis has occurred after the first dose and in some cases >1 year after initiation of regular treatment. Due to the risk, patients should be observed closely for an appropriate time period after administration and should receive treatment only under direct medical supervision. Healthcare providers should be prepared to administer appropriate therapy for managing potentially life-threatening anaphylaxis. Patients should be instructed on identifying signs/symptoms of anaphylaxis and to seek immediate care if they arise.      Contraindications  Severe hypersensitivity reaction to omalizumab  or any component of the formulation    Warnings & Precautions  Cardiovascular effects: Cerebrovascular events, including transient ischemic attack and ischemic stroke, have been reported.  Eosinophilia and vasculitis: In rare cases, patients may present with systemic eosinophilia, sometimes presenting with clinical features of vasculitis.    Fever/arthralgia/rash: Reports of a constellation of symptoms including fever, arthritis or arthralgia, rash, and lymphadenopathy have been reported with post-marketing use.  Malignant neoplasms: Have been reported rarely with use in short-term studies; impact of long-term use is not known.  Parasitic infections: Use with caution and monitor patients at high risk for parasitic infections; risk of infection may be increased; appropriate duration of continued monitoring following  therapy discontinuation has not been established.  Corticosteroid therapy: Gradually taper systemic or inhaled corticosteroid therapy; do not discontinue corticosteroids abruptly following initiation of omalizumab  therapy. The combined use of omalizumab  and corticosteroids in patients with chronic idiopathic urticaria has not been evaluated.  Latex: Prefilled syringe: The needle cap may contain natural rubber latex.  Appropriate use: Therapy has not been shown to alleviate acute asthma exacerbations; do not use to treat acute bronchospasm, status asthmaticus, or other allergic conditions. Do not use to treat forms of urticaria other than chronic idiopathic urticaria.  Dosing/IgE levels: Dosing for asthma is based on body weight and pretreatment total IgE serum levels. IgE levels remain elevated up to 1 year following treatment; therefore, levels taken during treatment or for up to 1 year following treatment cannot and should not be used as a dosage guide. Dosing in chronic idiopathic urticaria is not dependent on serum IgE (free or total) level or body weight.  Pregnancy Considerations: Omalizumab  is a humanized monoclonal antibody (IgG1). Potential placental transfer of human IgG is dependent upon the IgG subclass and gestational age, generally increasing as pregnancy progresses.  Breastfeeding Considerations: It is not known if omalizumab  is present in breast milk; however, IgG is excreted in human milk.  Based on information from the pregnancy exposure registry, an increased risk of adverse events was not observed in breastfed infants of mothers using omalizumab . According to the manufacturer, the decision to breastfeed during therapy should consider the risk of infant exposure, the benefits of breastfeeding to the infant, and benefits of treatment to the mother      Drug/Food Interactions     Medication list reviewed in Epic. The patient was instructed to inform the care team before taking any new medications or supplements. No drug interactions identified.     Storage, Handling Precautions, & Disposal     Store this medication in the refrigerator, 2??C to 8??C (36??F to 46??F). in the original carton.  Protect from direct sunlight and do not freeze. May be removed and placed back in the refrigerator if needed; do not exceed a combined total time of 2 days out of the refrigerator.  Do not shake.  Dispose of used syringes in a sharps disposal container.      Current Medications (including OTC/herbals), Comorbidities and Allergies     Current Medications[1]    Allergies[2]    Problem List[3]    Medication list has been reviewed and updated in Epic: Yes    Allergies have been reviewed and updated in Epic: Yes    Appropriateness of Therapy     Acute infections noted within Epic:  No active infections  Patient reported infection: running nose and sore throat - patient reported to provider    Is the medication and dose appropriate considering the patient???s diagnosis, treatment, and disease journey, comorbidities, medical history, current medications, allergies, therapeutic goals, self-administration ability, and access barriers? Yes    Prescription has been clinically reviewed: Yes      Baseline Quality of Life Assessment      How many days over the past month did your chronic idiopathic urticaria  keep you from your normal activities? For example, brushing your teeth or getting up in the morning. Currently experiencing flares on fingers and arm, worsening by heat, sun and florasecent light    Financial Information     Medication Assistance provided: Prior Authorization    Anticipated copay of $4 reviewed with patient. Verified delivery address.    Delivery Information  Scheduled delivery date: 09/20/24    Expected start date: Continuation of therapy -next dose due asap    Medication will be delivered via UPS to the prescription address in [facility].  This shipment will not require a signature.      Explained the services we provide at [facility] and that each month we would call to set up refills.  Stressed importance of returning phone calls so that we could ensure they receive their medications in time each month.  Informed patient that we should be setting up refills 7-10 days prior to when they will run out of medication.  A pharmacist will reach out to perform a clinical assessment periodically.  Informed patient that a welcome packet, containing information about our pharmacy and other support services, a Notice of Privacy Practices, and a drug information handout will be sent.      The patient or caregiver noted above participated in the development of this care plan and knows that they can request review of or adjustments to the care plan at any time.      Patient or caregiver verbalized understanding of the above information as well as how to contact the pharmacy at [phone] option 4 with any questions/concerns.  The pharmacy is open Monday through Friday 8:30am-4:30pm.  A pharmacist is available 24/7 via pager to answer any clinical questions they may have.    Patient Specific Needs     Does the patient have any physical, cognitive, or cultural barriers? No    Does the patient have adequate living arrangements? (i.e. the ability to store and take their medication appropriately) Yes    Did you identify any home environmental safety or security hazards? No    Patient prefers to have medications discussed with  Patient     Is the patient or caregiver able to read and understand education materials at a high school level or above? Yes    Patient's primary language is  English     Is the patient high risk? No    Does the patient have an additional or emergency contact listed in their chart? Yes    SOCIAL DETERMINANTS OF HEALTH     At the [facility], we have learned that life circumstances - like trouble affording food, housing, utilities, or transportation can affect the health of many of our patients.   That is why we wanted to ask: are you currently experiencing any life circumstances that are negatively impacting your health and/or quality of life? Patient declined to answer    Social Drivers of Health     Food Insecurity: Food Insecurity Present (02/11/2024)    Hunger Vital Sign     Worried About Running Out of Food in the Last Year: Often true     Ran Out of Food in the Last Year: Often true   Tobacco Use: Medium Risk (08/08/2024)    Patient History     Smoking Tobacco Use: Former     Smokeless Tobacco Use: Never     Passive Exposure: Past   Transportation Needs: Unmet Transportation Needs (02/11/2024)    PRAPARE - Therapist, Art (Medical): Yes     Lack of Transportation (Non-Medical): Yes   Alcohol Use: Not on file   Housing: High Risk (02/11/2024)    Housing     Within the past 12 months, have you ever stayed: outside, in a car, in a tent, in an overnight shelter,  or temporarily in someone else's home (i.e. couch-surfing)?: Yes     Are you worried about losing your housing?: Yes   Physical Activity: Not on file   Utilities: High Risk (02/11/2024)    Utilities     Within the past 12 months, have you been unable to get utilities (heat, electricity) when it was really needed?: Yes   Stress: Not on file   Interpersonal Safety: Unknown (04/14/2024)    Interpersonal Safety     Unsafe Where You Currently Live: No     Physically Hurt by Anyone: Not on file     Abused by Anyone: Not on file   Substance Use: Not on file (08/16/2023)   Intimate Partner Violence: Not on file   Social Connections: Not on file   Financial Resource Strain: High Risk (02/11/2024)    Overall Financial Resource Strain (CARDIA)     Difficulty of Paying Living Expenses: Very hard   Health Literacy: Not on file   Internet Connectivity: Internet connectivity concern unknown (02/11/2024)    Internet Connectivity     Do you have access to internet services: Yes     How do you connect to the internet: Not on file     Is your internet connection strong enough for you to watch video on your device without major problems?: Not on file     Do you have enough data to get through the month?: Not on file     Does at least one of the devices have a camera that you can use for video chat?: Not on file       Would you be willing to receive help with any of the needs that you have identified today? Not applicable       Shelba DELENA Hummer, PharmD  [facility] Specialty Pharmacist       [1]   Current Outpatient Medications   Medication Sig Dispense Refill    acetaminophen  (TYLENOL ) 325 MG tablet Take 2 tablets (650 mg total) by mouth every six (6) hours. 30 tablet 0    azelastine  (ASTELIN ) 137 mcg (0.1 %) nasal spray 2 sprays into each nostril two (2) times a day. 30 mL 11    betamethasone  dipropionate 0.05 % cream Apply topically two (2) times a day as needed. 135 g 1    cetirizine  (ZYRTEC ) 10 MG tablet Take 3-4 tablets (30 - 40 mg total) by mouth twice daily for chronic hives. 240 tablet 11    clobetasol  (TEMOVATE ) 0.05 % cream Apply topically two (2) times a day. To affected areas, as needed. 60 g 11    clobetasol  (TEMOVATE ) 0.05 % ointment Apply topically two (2) times a day. To rash on hands/feet when flaring 60 g 1    clotrimazole -betamethasone  (LOTRISONE ) 1-0.05 % cream       cyclobenzaprine  (FLEXERIL ) 10 MG tablet Take 1 tablet (10 mg total) by mouth nightly as needed for muscle spasms. 20 tablet 0    cyclobenzaprine  (FLEXERIL ) 10 MG tablet Take 1 tablet (10 mg total) by mouth Three (3) times a day as needed for muscle spasms. (Patient not taking: Reported on 08/08/2024) 30 tablet 0    empty container Misc Use as directed to dispose of syringes 1 each 2    EPINEPHrine  (AUVI-Q ) 0.3 mg/0.3 mL injection Inject 0.3 mL (0.3 mg total) into the muscle every five (5) minutes as needed for anaphylaxis. 2 each 4    fluticasone  propionate (FLONASE ) 50 mcg/actuation nasal spray  2 sprays into each nostril two (2) times a day. 32 g 11    furosemide  (LASIX ) 40 MG tablet Take 1 tablet (40 mg total) by mouth daily as needed. 30 tablet 9    ibuprofen  (MOTRIN ) 600 MG tablet Take 1 tablet (600 mg total) by mouth Three (3) times a day. 60 tablet 0    methIMAzole  (TAPAZOLE ) 10 MG tablet Take 1 tablet (10 mg total) by mouth Two (2) times a day (30 minutes before a meal). 180 tablet 3    naproxen  (NAPROSYN ) 500 MG tablet Take 1 tablet (500 mg total) by mouth in the morning and 1 tablet (500 mg total) in the evening. Take with meals. 60 tablet 2    omalizumab  (XOLAIR ) 300 mg/2 mL syringe Inject 2 mL (300 mg total) under the skin every twenty-eight (28) days. 4 mL 11    pravastatin  (PRAVACHOL ) 20 MG tablet Take 1 tablet (20 mg total) by mouth daily. 30 tablet 11    pregabalin  (LYRICA ) 25 MG capsule Take 1 capsule (25 mg total) by mouth nightly for 21 days. 21 capsule 0    propranolol  (INDERAL  LA) 60 mg 24 hr capsule Take 2 capsules (120 mg total) by mouth daily. 180 capsule 1    semaglutide , weight loss, (WEGOVY ) 0.25 mg/0.5 mL injection pen Inject 0.25 mg under the skin every seven (7) days. 2 mL 0    semaglutide , weight loss, (WEGOVY ) 0.5 mg/0.5 mL injection pen Inject 0.5 mg under the skin every seven (7) days. 2 mL 0    sodium chloride  (OCEAN) 0.65 % nasal spray 2 sprays into each nostril four (4) times a day as needed. 50 mL 11    tacrolimus  (PROTOPIC ) 0.1 % ointment Apply 1 gram topically to affected area of the skin twice daily when not flaring 100 g 5    triamcinolone  (KENALOG ) 0.1 % cream Apply topically two (2) times a day. 453.6 g 2    triamcinolone  (KENALOG ) 0.5 % ointment Apply topically two (2) times a day. 15 g 2     No current facility-administered medications for this visit.   [2]   Allergies  Allergen Reactions    Montelukast  Other (See Comments)     Felt like her throat was tightening up ML,CMA     Permethrin Hives, Itching and Rash    Atorvastatin       Headache     Rosuvastatin       Myalgias      Simvastatin      Sulfa (Sulfonamide Antibiotics) Rash   [3]   Patient Active Problem List  Diagnosis    Athletes foot    Chronic idiopathic urticaria    Coughing    Fluid retention    Generalized bloating    Nail fungal infection    Weight gain, abnormal    Ear pressure    Sensation of skin crawling    Nasal and sinus discharge    Skin irritation    Allergic rhinitis    Dysfunction of eustachian tube    History of ischemic stroke    Aphasia due to recent stroke    Carotid artery, internal, occlusion, left    Post-traumatic headache    Essential hypertension    Iron  deficiency anemia secondary to inadequate dietary iron  intake    Disc disease, degenerative, cervical    Fibroid uterus    Central retinal artery occlusion of left eye    Ischemic stroke    (CMS-HCC)    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

## 2024-09-18 NOTE — Telephone Encounter (Signed)
 [facility]: Schedule follow up with [facility] per mychart request
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
## 2024-09-28 NOTE — Progress Notes (Unsigned)
 [facility]  [address]  [address], [address]  [place], [place] [address]      Chief complaint: follow up for CIU    Assessment and Plan:   Mason Burleigh is a 43 y.o. woman with chronic spontaneous/idiopathic urticaria who was seen in follow-up for ongoing CIU and chronic allergic rhinosinusitis.     Assessment/Plan:          Intrinsic (allergic) eczema / dyshidrotic eczema: Chronic eczema with hand and stomach involvement, exacerbated by winter. Over-the-counter treatments ineffective, partial relief with clobetasol . Potential for improvement with persistent discoloration. Clotrimazole -betamethasone  medication is the only thing that helps.  - Encouraged patient to apply daily bland emollient to maintain skin barrier  - Prescribed clotrimazole  betamethasone  cream with refills.  - Sent prescription to [facility].    Chronic allergic rhinosinusitis (DP/DF dust mite, cat, dog, cockroach, molds, trees, grasses, and weeds): Poorly controlled but currently stable. We reviewed that allergen immunotherapy (AIT) could help with her symptoms, which persist despite good medical management.   - STOP Flonase  2 sprays per nostril BID  - TRIAL adding budesonide  0.25 mg in daily sinus rinses 3 times weekly (given trouble remembering to do/difficulty doing rinses/using medications)    -- Other rinses (may use as many as she likes daily) without budesonide  but with salt solution  - Continue azelastine  (Astelin , Astepro ) 1-2 sprays per nostril BID PRN rhinitis  - Allergen immunotherapy is an option should her symptoms persist despite medical intervention or by patient preference    -- Patient will call insurance to find out cost of SCIT; information provided  - Consider re-referral to ENT given severe turbinate edema, ?polypoid swelling on CT scan     Chronic spontaneous urticaria, controlled on Xolair : Stable. Presumed autoimmune given the multiple diagnoses (Grave's disease, lupus). She is doing well on Xolair  with no issues tolerating this medication. She recently went 6 weeks without Xolair  (due to forgetting to order it) but experienced angioedema episodes; this indicates she is not ready to discontinue or wean this medication. On her current regimen, Xolair  with high dose antihistamines are completely controlling her episodes of urticaria/angioedema.  - Continue Xolair  shots every 4 weeks    -- Will try to see if we can get her medication sent to [facility]  - Continue 6-8 tablets daily Zyrtec  (3-4 tablets BID)   - Continue hydroxyzine /Atarax  10 mg q8H PRN - refilled today  - Continue follow up with other physicians for autoimmune diseases/issues    Return in about 6 months (around 03/30/2025) for Recheck CRS, CIU.    I personally spent 35 minutes face-to-face and non-face-to-face in the care of this patient, which includes all pre, intra, and post visit time on the date of service.  All documented time was specific to the E/M visit and does not include any procedures that may have been performed.    No orders of the defined types were placed in this encounter.      Rocky Percy, MD, PhD  Allergy & Immunology    Subjective:   Last clinic visit: 02/25/2024    INTERVAL HISTORY OF PRESENT ILLNESS:  Janaiyah Blackard is a 43 y.o. woman with chronic spontaneous/idiopathic urticaria who presented for follow-up of CSU on Xolair  (covered by [facility]). I independently reviewed available records since the patient's last visit, which are shown below when relevant. She is accompanied by her partner today, and both provide history.    History of Present Illness  Dora Simeone is a 43 year old female with eczema and thyroid dysfunction who presents with a worsening rash and sinus issues.    She has a persistent rash primarily on her hands, which has now spread to her stomach. The rash is itchy, burning, dry, and sometimes wet. She has been using a big jar cream, Vaseline cream, and Vaseline, but these have not provided significant relief. Clobetasol  and clotrimazole  betamethasone  cream have been somewhat effective, particularly for her hands, which have been severely affected by the rash.    Her thyroid has been unstable since June or July, leading to a weight loss of 75 pounds since April. She feels much better now that she is taking her medication and her thyroid is regulated. Previously, she experienced excessive sleepiness when her thyroid was not controlled.    She has not experienced hives recently and has been off Xolair  for about a month and a half, but resumed it last week. During the period off Xolair , she felt her skin was crawling and was more itchy, with increased sensitivity to sunlight and fluorescent lights. Dust also exacerbates her symptoms. When on Xolair , these symptoms improve significantly, and she receives the injection every four weeks.    She has ongoing sinus issues and was advised by an ENT to use Flonase . She has been using it inconsistently, about five sprays a week, and also uses sinus rinses once a week. She plans to increase the frequency of rinses to three times a week with her mother's help to remember.        Answers submitted by the patient for this visit:  RETURN ALLERGY on 09/29/2024 12:30 PM with Rocky Percy, MD  Rash Questionnaire (Submitted on 09/29/2024)  Chief Complaint: Rash  Chronicity: chronic  Onset: more than 1 month ago  Progression since onset: gradually worsening  Affected locations: abdomen, left fingers, right fingers  Characteristics: blistering, pain, itchiness, peeling, scaling  Exposed to: unknown  anorexia: No  congestion: No  cough: No  diarrhea: No  eye pain: No  facial edema: No  fatigue: No  fever: No  joint pain: No  nail changes: No  rhinorrhea: No  shortness of breath: No  sore throat: No  vomiting: No      ROS: Pertinent positive and negatives included in the above HPI. All other systems reviewed are negative.    Last visit with me/Allergy: KAYDRA BORGEN is a 43 year old female with eczema and allergic rhinitis who presents for management of skin and allergy symptoms.     She experiences ongoing issues with her fingers and hands breaking out, characterized by dry, itchy skin with wet bumps that appear suddenly and leave scar tissue. Steroid creams have been effective in managing these symptoms. Dust, pollen, sunlight, and cat hair exacerbate her skin condition.     She is currently on Xolair , administered every 28 days, which helps control her symptoms. She notices increased breakouts if she misses a dose, with symptoms including facial swelling, particularly under her eyes and lips.     For her sinus issues, she uses Astelin  and Zyrtec , taking five to six Zyrtec  a day. She reports fluid in her ear and nasal inflammation. She experiences occasional sinus pressure and swelling under her eyes. She has used Flonase  in the past but has reduced its use recently.     Her past medical history includes frequent ear infections as a child, requiring surgical removal of displaced ear tubes. She has a history of  eczema and allergic rhinitis, and reports allergies to dust, pollen, and pet dander.     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     No current ear pain. Occasional sinus pressure and swelling under the eyes. No significant nasal congestion or mucus production at present.          Interval internal/external records:  Dermatology Visit 07/18/2024:   Assessment and Plan:       Dyshidrotic eczema, hand and feet: chronic, not flaring currently but not at patient goal  Tried and failed: triamcinolone , betamethasone , clobetasol , clotrimazole -betamethasone   -Discussed the diagnosis and reassured the pt regarding the benign nature of this condition  -Start clobetasol  0.05% ointment bid with overnight glove occlusion  -Start tacrolimus  0.1% ointment BID when not flared  -Encouraged pt to use moisturizer following each hand washing  -Encouraged pt to decrease frequency of hand washing to < 5 times daily  -General skin care was discussed including: at least twice daily use of a greasy emollient (recommended cerave cream, vanicream, vaseline, aquaphor ointment), taking warm showers once daily lasting 10 minutes or less, using a mild soap (recommended Dove), immediate application of emollient after exiting bath/shower and avoiding skin care products that contain fragrances  - discussed options for further systemic treatments including biologics in the future if no improvement      ENT Visit 08/08/2024: Assessment & Plan  Chronic nasal congestion and drainage  History of environmental allergies   Chronic sinusitis with bilateral nasal congestion and drainage, ongoing for years. Symptoms suggest chronic inflammation of the sinuses without acute bacterial infection. Previous CT showed left maxillary sinus thickening, possibly odontogenic in origin. Allergic rhinitis also likely with environmental allergies. Previous treatments with Zyrtec  and Flonase  were not fully effective, and Astelin  caused dryness.  - Start NeilMed sinus rinses twice daily with distilled or boiled water that has been completely cooled down. Provided bottle with verbal and written instructions.   - Continue Zyrtec  as needed.  - Use Astelin  spray BID bilaterally after sinus rinses, and PRN before allergen exposure.  - Start Flonase  BID bilaterally, provided instruction on proper application   - Use a humidifier by the bed while sleeping.  - Use Ocean spray at least four times a day.  - Apply Vaseline to the inner edge of the nostrils.  - Can consider PO steroids and antibiotics in the future      Thyroid nodules  Hyperthyroidism.   Recent ultrasound recommended biopsy, but endocrinologist reportedly advised delay until thyroid levels stabilize. Concerns about malignancy due to family history and symptoms.  - Continue to follow up with PCP and endocrinology re: thyroid dysfunction and nodules.   - Recommended FNAs of the suspicious nodules as per guidelines.   - Refer to [facility] Dr. Alm Oram for evaluation and potential biopsy.  - Discussed potential surgical intervention if biopsy indicates malignancy.     Tinnitus - self resolved   Brief bout that self resolved and occurred in the past when she was having URI symptoms and muffled hearing, both now resolved. Medications reviewed; no obvious medication issue. On audiogram, patient does have hearing loss.    - Discussed with patient that the most common causes of tinnitus are hearing loss and medications.      Past Medical/Surgical/Allergy/Immunization/Social/Family History:  Reviewed and updated in Epic since last visit on 02/25/2024.    Current Outpatient Medications   Medication Sig Dispense Refill  acetaminophen  (TYLENOL ) 325 MG tablet Take 2 tablets (650 mg total) by mouth every six (6) hours. 30 tablet 0    azelastine  (ASTELIN ) 137 mcg (0.1 %) nasal spray 2 sprays into each nostril two (2) times a day. 30 mL 11    betamethasone  dipropionate 0.05 % cream Apply topically two (2) times a day as needed. 135 g 1    budesonide  (PULMICORT ) 0.25 mg/2 mL nebulizer solution Inhale 2 mL (0.25 mg total) by nebulization 3 (three) times a week. 72 mL 3    cetirizine  (ZYRTEC ) 10 MG tablet Take 3-4 tablets (30 - 40 mg total) by mouth twice daily for chronic hives. 240 tablet 11    clobetasol  (TEMOVATE ) 0.05 % cream Apply topically two (2) times a day. To affected areas, as needed. 60 g 11    clobetasol  (TEMOVATE ) 0.05 % ointment Apply topically two (2) times a day. To rash on hands/feet when flaring 60 g 1    clotrimazole -betamethasone  (LOTRISONE ) 1-0.05 % cream Apply topically two (2) times a day. 30 g 3    cyclobenzaprine  (FLEXERIL ) 10 MG tablet Take 1 tablet (10 mg total) by mouth nightly as needed for muscle spasms. 20 tablet 0    cyclobenzaprine  (FLEXERIL ) 10 MG tablet Take 1 tablet (10 mg total) by mouth Three (3) times a day as needed for muscle spasms. (Patient not taking: Reported on 09/29/2024) 30 tablet 0    diclofenac  (VOLTAREN ) 75 MG EC tablet Take 1 tablet (75 mg total) by mouth two (2) times a day.      empty container Misc Use as directed to dispose of syringes 1 each 2    EPINEPHrine  (AUVI-Q ) 0.3 mg/0.3 mL injection Inject 0.3 mL (0.3 mg total) into the muscle every five (5) minutes as needed for anaphylaxis. 2 each 4    fluticasone  propionate (FLONASE ) 50 mcg/actuation nasal spray 2 sprays into each nostril two (2) times a day. 32 g 11    furosemide  (LASIX ) 40 MG tablet Take 1 tablet (40 mg total) by mouth daily as needed. 30 tablet 9    ibuprofen  (MOTRIN ) 600 MG tablet Take 1 tablet (600 mg total) by mouth Three (3) times a day. 60 tablet 0    methIMAzole  (TAPAZOLE ) 10 MG tablet Take 1 tablet (10 mg total) by mouth Two (2) times a day (30 minutes before a meal). 180 tablet 3    naproxen  (NAPROSYN ) 500 MG tablet Take 1 tablet (500 mg total) by mouth in the morning and 1 tablet (500 mg total) in the evening. Take with meals. 60 tablet 2    nystatin  (MYCOSTATIN ) 100,000 unit/mL suspension Take 5 mL (500,000 Units total) by mouth four (4) times a day for 7 days. 60 mL 0    omalizumab  (XOLAIR ) 300 mg/2 mL syringe Inject 2 mL (300 mg total) under the skin every twenty-eight (28) days. 4 mL 11    pravastatin  (PRAVACHOL ) 20 MG tablet Take 1 tablet (20 mg total) by mouth daily. 30 tablet 11    pregabalin  (LYRICA ) 25 MG capsule Take lyrica  25mg  BID for 1 week, if tolerated increase to 50mg  nightly and 25mg  in morning for 1 week. If tolerated increase to 50mg  BID for 1 week. If tolerated increase to 75mg  nightly and 50mg  in morning. If tolerated increase to 75mg  BID. 180 capsule 1    propranolol  (INDERAL  LA) 60 mg 24 hr capsule Take 2 capsules (120 mg total) by mouth daily. 180 capsule 1    semaglutide , weight loss, (WEGOVY )  0.25 mg/0.5 mL injection pen Inject 0.25 mg under the skin every seven (7) days. 2 mL 0    semaglutide , weight loss, (WEGOVY ) 0.5 mg/0.5 mL injection pen Inject 0.5 mg under the skin every seven (7) days. 2 mL 0    sodium chloride  (OCEAN) 0.65 % nasal spray 2 sprays into each nostril four (4) times a day as needed. 50 mL 11    tacrolimus  (PROTOPIC ) 0.1 % ointment Apply 1 gram topically to affected area of the skin twice daily when not flaring 100 g 5    triamcinolone  (KENALOG ) 0.1 % cream Apply topically two (2) times a day. 453.6 g 2    triamcinolone  (KENALOG ) 0.5 % ointment Apply topically two (2) times a day. 15 g 2     No current facility-administered medications for this visit.         Objective:   PHYSICAL EXAM:   Vitals: BP 114/76 (BP Site: L Arm, BP Position: Sitting)  - Pulse 73  - SpO2 99%   Constitutional: Middle-aged woman sitting comfortably in no acute distress. Pleasant, conversant.  Head, Eyes, Ears, Nose, Throat, Neck: Sclera anicteric, conjunctivae non-injected. Mild allergic shiners. Moist mucous membranes.  Respiratory: Normal work of breathing on room air. Able to speak in full sentences without issues.   Skin: No hives or swelling. Warm and well perfused without cyanosis, clubbing, or edema.  Neurologic: Awake, alert and oriented to person, place, and time.  Psychiatric: Normal affect and mood. Thought process linear.    Laboratory testing independently reviewed and interpreted below:  Component      Latest Ref Rng 07/19/2024   TSH      0.550 - 4.780 uIU/mL <0.010 (L)    Free T4      0.89 - 1.76 ng/dL 5.72 (H)    T3, Total      60.0 - 180.0 ng/dL 499.8 (H)       Legend:  (L) Low  (H) High    Interpretation: Labs reviewed. Free T4 and T3 (total) are very elevated but improved from last measure. TSH is undetectable.      Imaging independently reviewed and interpreted below:    Narrative   EXAM: US  THYROID   ACCESSION: [id number] UN   REPORT DATE: 06/07/2024 10:31 AM      CLINICAL INDICATION: 43 years old with hyperthyroidism with goiter  - E05.90-Hyperthyroidism COMPARISON: 2024      TECHNIQUE:  Ultrasound views of the thyroid were obtained using gray scale and limited color Doppler imaging.     Impression   Upgraded TI-RADS 3 nodule #1 measures 2.6 cm with imaging features warranting FNA.   Upgraded TI-RADS 5 nodule #4 measures 1.4 cm with imaging features warranting FNA.   Additional TI-RADS 3 nodules #2 and 3 with imaging features warranting follow-up ultrasound in 1 year      TI-RADS 1 (0 points): Benign- No FNA indication   TI-RADS 2 (2 points): Not suspicious- No FNA indicated   TI-RADS 3 (3 points): Mildly suspicious- FNA is > or = 2.5 cm, follow if > or = 1.5 cm   TI-RADS 4 (4-6 points): Moderately suspicious- FNA if > or = 1.5 or follow if > or = 1.0 cm   TI-RADS 5 (7 or more points): Highly suspicious- FNA if >=1.0 cm, follow if >=0.5 cm   NOTE:  The TI-RADS classification of thyroid nodules has been adopted to standardize risk stratification based on a common lexicon to inform practitioners about which nodules warrant biopsy.  The imaging criteria for TI-RADS criteria and documentation are available online at [email or web address]      Please see below for data measurements:         Right thyroid: Sagittal 6.2 cm; AP 3.6 cm; Transverse 3.5 cm   Left thyroid: Sagittal 5.9 cm; AP 2.5 cm; Transverse 3.1 cm      Isthmus: 2.0 cm        Interpretation: Agree with radiologist's read.      Testing performed in clinic today:  None indicated. independently reviewed and interpreted below:    Narrative   EXAM: US  THYROID   ACCESSION: [id number] UN   REPORT DATE: 06/07/2024 10:31 AM      CLINICAL INDICATION: 43 years old with hyperthyroidism with goiter  - E05.90-Hyperthyroidism      COMPARISON: 2024      TECHNIQUE:  Ultrasound views of the thyroid were obtained using gray scale and limited color Doppler imaging.     Impression   Upgraded TI-RADS 3 nodule #1 measures 2.6 cm with imaging features warranting FNA.   Upgraded TI-RADS 5 nodule #4 measures 1.4 cm with imaging features warranting FNA.   Additional TI-RADS 3 nodules #2 and 3 with imaging features warranting follow-up ultrasound in 1 year      TI-RADS 1 (0 points): Benign- No FNA indication   TI-RADS 2 (2 points): Not suspicious- No FNA indicated   TI-RADS 3 (3 points): Mildly suspicious- FNA is > or = 2.5 cm, follow if > or = 1.5 cm   TI-RADS 4 (4-6 points): Moderately suspicious- FNA if > or = 1.5 or follow if > or = 1.0 cm   TI-RADS 5 (7 or more points): Highly suspicious- FNA if >=1.0 cm, follow if >=0.5 cm   NOTE:  The TI-RADS classification of thyroid nodules has been adopted to standardize risk stratification based on a common lexicon to inform practitioners about which nodules warrant biopsy.  The imaging criteria for TI-RADS criteria and documentation are available online at [email or web address]      Please see below for data measurements:         Right thyroid: Sagittal 6.2 cm; AP 3.6 cm; Transverse 3.5 cm   Left thyroid: Sagittal 5.9 cm; AP 2.5 cm; Transverse 3.1 cm      Isthmus: 2.0 cm        Interpretation: Agree with radiologist's read.      Testing performed in clinic today:  None indicated.  Answers submitted by the patient for this visit:  RETURN ALLERGY on 09/29/2024 12:30 PM with Rocky Percy, MD  Rash Questionnaire (Submitted on 09/29/2024)  Chief Complaint: Rash  Chronicity: chronic  Onset: more than 1 month ago  Progression since onset: gradually worsening  Affected locations: abdomen, left fingers, right fingers  Characteristics: blistering, pain, itchiness, peeling, scaling  Exposed to: unknown  anorexia: No  congestion: No  cough: No  diarrhea: No  eye pain: No  facial edema: No  fatigue: No  fever: No  joint pain: No  nail changes: No  rhinorrhea: No  shortness of breath: No  sore throat: No  vomiting: No
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
## 2024-09-28 NOTE — Patient Instructions (Signed)
 [facility]  [address]  5th Floor, [address]  [place], [place] [address]        Your provider today was: Rocky Percy (Attending)    It was a pleasure seeing you again today! Here is your plan:          Thank you for letting us  be involved with your medical care!    Contact Information:      Appointments and Referrals   [phone]     Emergencies, refills, medical questions    [phone]) [phone]  Ask for the Allergy/Immunology Doctor on call     You can also use [email or web address] ([email or web address] to request refills, access test results, and send questions to your doctor! However, you should expect to get a response within 2-3 business days after sending your message (and not immediately). If it is an urgent issue, please contact the on call physician (see above).    Chronic RhinoSinusitis without Nasal Polyposis (CRS w/o NP)    What is Chronic rhinosinusitis (CRS)?                                                                                                                        CRS is a condition where one's sinuses and nasal passages are inflamed for 12 weeks or longer, as seen by sinonasal endoscopy (when a doctor looks in your nose and sinuses with a camera) and/or CT scan. Inflammation generally means swelling and with thick mucus that looks like pus, but may include other symptoms (see below).    Patients experience at least 2 of the following four symptoms: drainage of mucous that looks like pus (mucopurulent drainage), stuffy nose (congestion), facial pain / pressure / fullness, and a decreased or loss of one's sense of smell (anosmia) or cough (in children).    What is CRS w/o NP?                                                                                                                                                     About one-third of patients with CRS have something called nasal polyps on BOTH sides of their sinuses. Polyps are a growth of inflammatory material and looks like clear gelatin. Polyps can block normal draining of the sinuses, predisposing to infection and adding to  congestion (stuffiness).    That means that the majority of patients with CRS (about two-thirds) do NOT have nasal polyps.    So then why do we care about whether someone has nasal polyps or not? The simple answer is that treatment is a little different based on whether a patient has nasal polyps or not.    How do you treat CRS w/o NP?                                                                                                                                     Management of CRS w/o NP involves treating from multiple angles: by controlling the inflammation and swelling in the sinuses, maintaining good drainage of the sinuses, treatment of colonizing or infecting germs, and minimizing the exacerbations (worsened symptoms). We also will often check a patient's immune system to make sure it is working properly; sometimes when someone's immune system is weak and cannot fight germs well, they can be more likely to get chronic sinus infections. If this is the case, we may treat their infections a little differently (like with antibiotics to prevent sinus infections, called antibiotic prophylaxis).    For cigarette smokers, the most important change is to quit smoking!!    Referral to an Ear-Nose-Throat (ENT) doctor can be helpful if there is a surgical option or if a patient has exhausted medical treatments. ENT doctors can also obtain bacterial cultures and biopsies to evaluate for other disorders.    Sinus rinses, using a neti pot or NeilMed squeeze bottle, is a mainstay of CRS w/o NP treatment. This encourages the sinuses to drain and can keep certain bacteria manageable. Steroid nasal sprays are the cornerstone for CRS w/o NP management, as they help manage inflammation and swelling/congestion. Antibiotics are mostly used for acute worsening of symptoms (not long-term therapy); they can be given systemically or topically in a sinus rinse. Medicines like Singulair  (montelukast ) may also be helpful, especially if the patient has allergies. Oral corticosteroids (like prednisone ) can also be quite effective but the effects are temporary.               If all these therapies fail, there could be a few different reasons. One could be that there are anatomic reasons (for instance, abnormal shape of sinuses, incomplete development of sinuses and where sinuses drain) for continued sinus inflammation. Sometimes surgery is a treatment in these cases, which is handled by our ENT colleagues. Another reason is that an incompletely treated sinus infection. In these cases, we often prescribe a longer treatment course of both oral corticosteroids (prednisone ) and antibiotics - sometimes for one month or longer. A final reason is a weakened immune system. This was discussed above; treatment may include antibiotic prophylaxis and, rarely, regular infusions of donated antibodies (called immunoglobulin replacement therapy) to keep you healthy.    Where can I find more information about CRS w/o NP?  American Academy of Allergy, Asthma, & Immunology website:  leftjournal.cz       European Forum for [employer] and Education in Allergy and Airway Diseases website:  https://www.euforea.eu/chronic-rhinosinusitis        Allergic Rhinitis Care Plan     Carrie Bush is allergic to:   Dust mites, cat, dog, cockroach, molds, trees, grasses, and weeds       Allergic rhinitis, also known as hay fever, affects approximately 20 percent of people of all ages. The most common symptoms include nasal itching, watery nasal discharge, sneezing, itchy red eyes, sore throat, or hoarse voice.  One of the first steps in treating any allergic condition is to avoid or minimize exposure to the allergens that cause the condition.  However, avoidance alone is usually not enough to control symptoms, and therefore medications or allergen immunotherapy (allergy shots) are needed.    RECOMMENDATIONS:    Reduce exposure to known triggers.  See the table below for ways to reduce exposure to the allergens that are causing your symptoms.    Nasal irrigation and saline sprays:  Rinsing the nose with a salt water (saline) solution can frequently improve nasal symptoms.  A variety of devices, including bulb syringes, Neti pots, and bottle sprayers, may be used to perform nasal irrigation.  Nasal irrigation with warmed saline can be performed once per day, twice daily for increased symptoms, or as many times as you want per day to help with symptoms.  Saline rinses can be purchased over-the-counter or made at home by adding 1 teaspoon of pickling/canning salt (NOT table salt) and 1 teaspoon of baking soda to 1 quart of STERILIZED water (NOT tap water).    Nasal spray technique:  Aim bottle tip towards the outside part of nose (away from the septum in the middle) for best benefit and to minimize side effects (dryness, and mild nose bleeds). These videos review the technique that will make the spray work more effectively and minimize adverse effects. Shorter video: [email or web address].[email or web address]; Longer video: [email or web address].    Nasal medications:  Nasal glucocorticoids (steroids) delivered by a nasal spray are the first-line treatment for the symptoms of allergic rhinitis.  Nasal antihistamines may also be helpful if prescribed by your provider.  Remember to use these medications as prescribed and AFTER any nasal irrigation (you don't want to wash away the medicine!).  Your nasal medications are:  Intranasal steroid: fluticasone  propionate (Flonase ) 2 sprays per nostril once daily (or split into 1 spray per nostril 2 times a day)  You may increase to 2-2-2 (2 sprays per nostril 2 times a day for 2 weeks) when restarting medicine after a break or if already using it but symptoms worsen  These medicines can also be found over-the-counter. Comparable medicines include fluticasone  (Flonase ), fluticasone  furoate (Flonase  Sensimist), budesonide  (Rhinocort ), triamcinolone  (Nasacort ), ciclesonide (Omnaris), mometasone (Nasonex), and beclomethasone (Qnasl).  Intranasal antihistamine: azelastine  (Astelin ) 1-2 sprays per nostril twice daily as needed for symptoms    Oral antihistamines:  These medications can be helpful, but are usually less effective than nasal medications.  Most are available over-the-counter, and it is best to use the less-sedating (less drowsy) medicines such as cetirizine  (Zyrtec ), fexofenadine (Allegra) or loratadine (Claritin).  I recommend:  cetirizine  (Zyrtec ) 10 mg once daily or as needed for symptoms (or as you are taking for chronic hives).  Large quantities of these medicines can be found relatively cheap at places like [employer], [employer], and [employer], or online at Amazon.com.    Antihistamine eye drops:  If your are having bothersome eye symptoms such as red, itchy eyes, then prescription or over-the-counter antihistamine eye drops may help.  I recommend:  Over-the-counter antihistamine eye drops: ketotifen (Zaditor, Alaway) or olopatadine (Pataday) 0.2% 1 drop in each eye twice daily (at least 8 hours in between doses) as needed for eye allergy symptoms   Contact lens wearers: These medicines must be used at least 15 minutes before putting in your contact lenses and after removing your contact lenses. Do not use with your lenses in!  The Equate brand at [facility] is the cheapest brand we have found in the area.    Other medications:   omalizumab  (Xolair ) 300 mg injection every 4 weeks (for hives)    The above treatment plan should improve symptoms for most people with allergic rhinitis.  However, if you do not improve or have bothersome side effects from the medications, then you may benefit from allergen immunotherapy (allergy shots).  Please let your healthcare provider know if you would like more information about allergen immunotherapy.    Allergen Recommendations for reducing exposure   Animal dander (cats, dogs) Remove animal from house, or at minimum, keep animal out of patient's bedroom. Keep pet in a room with a HEPA filter and replace the filter as recommended by the manufacturer.   Cover air ducts that lead to bedroom with filters. Replace filters as recommended by the manufacturer.  Use air filters and vacuums with HEPA filters. Replace the filter as recommended by the manufacturer.    Dust mites  Encase mattress, pillows, and boxspring in allergen-impermeable covers. Finely woven covers for pillows and duvets are preferable.   Wash bedding weekly in warm water with detergent or use electric dryer on hot setting.   Remove stuffed toys from the sleeping area. Stuffed toys may also be washed as above or put in freezer overnight once weekly.  Reduce indoor humidity to <50 percent. Avoid use of humidifiers.  Consider removing carpets from the bedroom. Replace old upholstered furniture with leather, vinyl, or wood.   Pollens (tree, grass, weeds), outdoor molds Keep home and car windows closed during pollen seasons. Use air conditioning.  Shower or bathe before bedtime to wash away any pollen   Cockroaches Use poison bait or traps to control. Consult professional exterminator for severe infestation.   Periodically clean home thoroughly. Encase all food fully and do not store garbage or papers inside the home.   Fix water leaks.   Indoor molds Clean moldy surfaces with dilute bleach solution.   Fix water leaks.   Reduce indoor humidity to <50 percent. Avoid use of humidifiers. Evaporative (or swamp) coolers should be avoided or cleaned regularly.     For more information, please visit the following websites:    UpToDate  retrocab.uy    MedLine Plus  https://www.avila.info/      NASAL STEROID USE INSTRUCTIONS          BUFFERED ISOTONIC SALINE NASAL IRRIGATION     The Benefits:    1. When you irrigate, the isotonic saline (salt water) acts as a solvent and washes the mucus crusts and other debris from your nose.    2. This decongests and improves the airflow into your nose.  The sinus passages begin to open.    3. Studies have also shown that a salt water and an alkaline (baking soda) irrigation solution improves nasal membrane cell function (mucociliary flow of mucus debris).    The Recipe:    1. Choose a  1-quart glass jar that is thoroughly cleansed.    2. Fill with sterile or distilled water, or you can boil water from the tap.    3. Add 1 to 2 heaping teaspoons of pickling/canning/sea salt (NOT table salt as it contains a large number of additives).  This salt is available at the grocery store in the food canning section.    4. Add 1 teaspoon of Arm [employer] Hammer Baking Soda (pure bicarbonate).    5. Mix ingredients together and store at room temperature.  Discard after one week.  If you find this solution too strong, you may decrease the amount of salt added to 1 to 1 ?? teaspoons.  With children it is often best to start with a milder solution and advance slowly.  Irrigate with 240 ml (8 oz) twice daily.    The Instructions:    You should plan to irrigate your nose with buffered isotonic saline 2 times per day.  Many people prefer to warm the solution slightly in the microwave - but be sure that the solution is NOT HOT.  Stand over the sink (some do this in the shower) and squirt the solution into each side of your nose, keeping your mouth open. This allows you to spit the saltwater out of your mouth.  It will not harm you if you swallow a little.    If you have been told to use a nasal steroid such as Flonase , Nasonex, or Nasacort , you should always use isotonic saline solution first, then use your nasal steroid product.  The nasal steroid is much more effective when sprayed onto clean nasal membranes and the steroid medicine will reach deeper into the nose.    Most people experience a little burning sensation the first few times they use a isotonic saline solution, but this usually goes away within a few days.                 ALLERGEN IMMUNOTHERAPY: The Basics    What is it?  Allergen immunotherapy (or allergy shots) is a form of treatment designed to decrease your sensitivity to substances called allergens. Allergens, like cat dander or grass pollen, trigger your allergy symptoms when you are exposed to them. Specific allergy testing has identified what allergens you are sensitive to.     Allergen immunotherapy involves injecting increasing amounts of an allergen over several months, with the goal of changing the way the immune system responds to the allergen. Your body responds to the injected amounts of a particular allergen, given in gradually increasing doses, by developing tolerance or desensitization to the allergen. As a result of these immune changes, immunotherapy can lead to decreased, minimal, or no allergy symptoms when you are exposed to the allergen and can even lead to long-lasting relief of allergy symptoms after treatment is completed.    The decision to begin immunotherapy is based on several factors including:   Severity of symptoms.  Length of allergy season and severity of symptoms.   How well medications and changes to your environment control your allergy symptoms.   Desire to avoid long-term medication use because of side-effects, cost, or preference.     There are two phases to immunotherapy: a build-up phase and a maintenance phase. The build-up phase involves receiving injections with increasing amounts of the allergens. The maintenance phase begins when the effective therapeutic dose is reached. Once the maintenance dose is reached, there will be longer periods of time between immunotherapy injections. The usual  interval between maintenance immunotherapy injections is every 4 weeks.    The benefits of immunotherapy, in terms of reduced allergy symptoms, can begin during the build-up phase but may take as long as 12 months on the maintenance dose. Improvement with immunotherapy may be progressive throughout the immunotherapy treatment period.     If there is no improvement after a year of maintenance immunotherapy, possible reasons for failure will be explored. If no apparent reason is found then the immunotherapy program will likely be discontinued and other treatment options will be pursued.     If immunotherapy is successful, maintenance treatment is generally continued for 3 to 5 years. The decision to stop immunotherapy will be discussed after 3 to 5 years of treatment. Some individuals may experience lasting remission of their allergy symptoms but others may relapse after discontinuing immunotherapy. Therefore, the decision to stop immunotherapy will be individualized.     How long do I have to be on immunotherapy?  Immunotherapy will require a significant time commitment. Patient non-adherence to the injection program, like frequently missing injections, can compromise the safety of the therapy making it more risky and less effective.    What are the risks of immunotherapy?   May vary depending on age and other medical conditions, such as heart disease, high blood pressure, or poorly-controlled asthma.    Immunotherapy should be given under the supervision of a physician in a facility equipped with the proper staff and equipment to identify and treat adverse reactions to allergy injections. Ideally, immunotherapy should be given in the prescribing allergist's office, but if this is not possible, we will provide the supervising physician with comprehensive instructions about your immunotherapy treatment.    There are two types of adverse reactions that occur with immunotherapy: local or systemic reactions. Local reactions are fairly common and present as redness and swelling at the injection site on the arm. This can happen immediately, or several hours after the treatment. Local reactions can be treated with cool compresses, topical anti-inflammatory creams, or antihistamines.     Systemic reactions are usually mild and respond rapidly to medications. Systemic reactions are much less common than local reactions, and 2-3% of patients receiving immunotherapy are reported to experience some form of these reactions. Symptoms can include increased allergy symptoms such as sneezing, nasal congestion or hives. Rarely, a serious systemic reaction, called anaphylaxis, can develop after an immunotherapy injection. In addition to the symptoms associated with a mild systemic reaction, symptoms of anaphylaxis include swelling in the throat, wheezing or a sensation of tightness in the chest, nausea, dizziness or other symptoms. Especially if not properly treated, anaphylaxis can be fatal. On the basis of data from a large survey, the risk of a fatal anaphylactic reaction from allergen immunotherapy has been calculated to be 1 in 2.5 million injections, that translates to about 3 deaths per year in the [place] . Near-fatal reactions, meaning serious systemic reactions that do not result in death, occur once in every 200,000 injections. There have been no deaths due to desensitization for the past 2 years and the [place] .      Systemic reactions require immediate treatment. Most serious systemic reactions develop within 30 minutes of the allergy injections and this is why you will wait in the office and be observed for 30 minutes after each injection. We are trained to monitor for such reactions and identify and treat systemic reactions. Any adverse reactions that occur after you leave our office, either local or systemic, needs  to be reported to us .    Please ask us  any additional questions you may have regarding allergen immunotherapy. Before we start, we ask that you sign a form that has this information. The purpose of your signature is to indicate you have read this material, understand the goals of the proposed treatment, and understand its risks.    Where can I find more information about immunotherapy?  More information about allergen immunotherapy can be found on the American Academy of Allergy, Asthma, and Immunology (AAAAI) web site: mapleflower.dk.        Dermatitis   Modified from: Skin Allergy Symptoms, Diagnosis, Treatment & Management - AAAAI Https://www.rivera.org/    Irritated skin can be caused by a variety of factors. These include immune system disorders, medications and infections. When an allergen is responsible for triggering an immune system response in the skin, then it is an allergic skin condition.     When an allergist sees someone with atopic dermatitis (eczema), they also must think about a skin condition that looks very similar (and can occur together) - allergic contact dermatitis.    Atopic Dermatitis (Eczema)   Atopic dermatitis (eczema) is a chronic or recurrent inflammatory skin disease. Atopic means that there is typically a genetic tendency toward allergic disease. Atopic dermatitis usually begins in the first few years of life and is often the initial indication that a child may later develop asthma and/or allergic rhinitis (hay fever).    In infants, eczema usually appears as tiny bumps on the cheeks. Older children and adults often experience rashes on the knees or elbows (often in the folds of the joints), on the backs of hands or on the scalp.        Itching, redness and swelling are common to most skin allergies. Yet there are some differences that help in the diagnosis of specific conditions.    Contact Dermatitis (Allergic or Irritant)  Contact dermatitis happens when your skin is directly touched by an allergen. For instance, if you have a nickel allergy and your skin comes in contact with jewelry made with even a very small amount of nickel, you may develop red, bumpy, scaly, itchy or swollen skin at the point of contact. Touching poison ivy, poison oak, or poison sumac can also cause allergic contact dermatitis. The red, itchy rash is caused by an oily coating covering these plants. The allergic reaction can come from actually touching the plant, or by touching clothing, pets or even gardening tools that have touched this oil.         Contact dermatitis may be allergic or irritant types. Irritant contact dermatitis (ICD) is more common (80%) and can happen in anyone, especially after coming into contact with something over and over again. Symptoms are a burning or stinging sensation with redness, swelling or peeling. Soaps, detergents, acids, bases, solvents, saliva, urine and stool are the most common triggers for ICD. Allergic contact dermatitis (ACD), on the other hand, is usually passed on from your mom or dad, and people tend to be very sensitive to whatever causes this skin allergy. Cosmetics, medicines, clothes dyes, as well as foods, rubber and poison ivy are common causes of ACD. Any topical cream or ointment may contain chemicals that irritate the skin. It is important to bring your personal products with you when you see your doctor so they can be examined as a potential cause of the dermatitis.     IMPORTANT!! The [facility] does not currently  evaluate or do allergy testing for contact dermatitis. This allergy testing is very specialized. If we think that you may have a contact dermatitis, we will refer you to a Dermatologist.      How do you tell the difference between atopic dermatitis and allergic contact dermatitis?  There are a few clues that make allergic contact dermatitis more likely in a patient with a red, dry, itchy rash. If the rash occurs only in specific places, like around your neck, on your hands, or on your face, this could mean that you have a contact dermatitis with jewelry, metals, hand soaps, lotions, or makeup products. If you work in a place with certain chemicals, like in a hair salon or healthcare setting, you are more likely to have a contact dermatitis. If your rash doesn't get better with treatment, ask your Dermatologist or Allergist if you could have a contact dermatitis.      Eczema Care Plan  The rest of this handout will focus on treatment for atopic dermatitis, although treatments and recommendations for allergic contact dermatitis are very similar in general.    Eczema is the most common skin condition, especially in children. It affects one in five infants but only 10% of adults. One explanation for eczema is thought to be due to ???leakiness??? of the skin barrier, which causes it to dry out and become prone to irritation and inflammation by many environmental factors. Also, some young children with eczema can flare with a particular food. In about half of patients with severe atopic dermatitis, the disease is due to inheritance of a faulty gene in their skin called filaggrin, although this is affected by your race and ethnicity. Unlike with urticaria (hives), histamine is not the only cause of the itch of eczema so anti-histamines may not control the symptoms. Eczema is often linked with asthma, allergic rhinitis (hay fever), and/or food allergy. When these conditions develop in this order, it is called the atopic march.     Symptoms  Symptoms of atopic dermatitis (eczema) include:   Patches of skin that can be red or darker/lighter than your normal skin color  Itchy skin, especially at night  Dry, cracked, or scaly skin  A patch of thick skin (thicker than the skin around the patch)   May ???weep??? or leak fluid, that dries to become a crust - this usually means the rash could be infected by a bacteria    In infants, eczema often appears on the face. Children are prone to have the rash at the bends of the elbow joint, wrists, behind the knees, and behind the ears. Adolescents and young adults typically have the rash in the same locations as children, as well as on the hands and feet.    Diagnosis  In many people, the exact cause of the eczema is not clear. It usually comes from mom or dad; this kind of condition is called hereditary, which means that someone inherits the condition from one or both parents.     Infants and young children with more severe eczema should be evaluated for food allergy. It???s important to see an allergist / immunologist for diagnosis and management. Often input from a dietitian is needed as well.    Food allergies causing eczema are much less common in older children and adults. If you are suspected of having eczema that is caused by a food allergy, a confirmed diagnosis requires avoiding the trigger food for about four weeks with the help of a dietitian  before doing a food challenge under your doctor???s supervision to confirm that the food was actually causing the flare.     Treatment  Eczema is sometimes described as an ???itch that rashes.??? This means that even without a rash, people with eczema tend to be very itchy. Then, when the patient scratches their skin, a rash will appear. The rash is still itchy, and so the patient keeps scratching it! This is called the Itch-Scratch Cycle and it is a big reason why eczema can be so tough to treat. Breaking the Itch-Scratch Cycle is important in the treatment of eczema.    I generally treat eczema in 2 ways - BOTH are necessary to control eczema:  Maintain or rebuild the skin barrier, and  Control inflammation in the skin.    Let's look at these two methods in more detail.    A person with eczema has skin that makes a poor barrier from the outside world, letting irritants and allergens into their skin. This activates the immune system, because it thinks it is being attacked and that you are in danger! A poor skin barrier also lets water evaporate, leaving skin dry. Therefore, making a good barrier for your skin is a good first step to treating your eczema.    How do we repair our skin barrier? Great question! The key is to apply moisturizers to the skin multiple times through the day. This hydrates the skin and makes the barrier stronger.    Ointments (plain petroleum jelly/Vaseline, Aquaphor, coconut oil) are thick and greasy and GREAT for repairing the skin! They work really well overnight.          Heavy Creams (Vanicream, CereVe, Cetaphil, Eucerin) are another good way to repair your skin. These are thick but not as greasy as ointments. *AVOID LOTIONS*, which are thin and have more alcohol in them - which helps them spread more easily on your skin, but it doesn't moisturize your skin as well as creams or ointments.        In children where the skin is oozing, crusting and painful, an infection that needs treatment with antibiotics may be the primary trigger.    Topical medications to reduce inflammation include topical steroids, calcineurin inhibitors, phosphodiesterase 4 inhibitors and topical JAK inhibitors. The itch is not relieved by antihistamines, although these are sometimes used at night to help people with eczema sleep.    There are several newer therapies approved for treatment of atopic dermatitis that is otherwise difficult to control. Dupilumab, a monoclonal antibody blocking interleukin-4 and interleukin-13, is an injectable biologic therapy that is used to treat adults and children 97 months of age and older with moderate-to-severe atopic dermatitis. Tralokinumab, a monoclonal antibody blocking interleukin-13, is another injectable biologic recently approved to treat adults age 82 and older with moderate- to-severe atopic dermatitis. Two oral JAK/STAT inhibitors have recently been approved for the treatment of moderate to severe atopic dermatitis. Upadacitinib is approved for age 19 and older and abrocitinib is approved for age 40 and older.    Antibiotics may be prescribed if a skin bacterial infection is suspected as a trigger for your eczema flare-up. Symptoms include crusting, oozing and pain. If a secondary fungal infection is suspected, it should be treated with antifungals. Oral steroids should be avoided, as although they are effective the eczema usually returns when the medicine is stopped. Oral steroids can also cause serious side-effects if taken for long periods of time.    Sometimes cotton undergarments and  body suits help protect the skin from irritants and from scratching. Avoid using soap products that contain sodium laurel sulfate and any triggers that cause a reaction. Your allergist will be able to help determine whether there is a trigger that can be avoided.    These skin allergy treatment and management strategies can relieve social challenges as well. People with eczema, especially children, are sometimes ignored or singled out by others who believe the rash is contagious.         RECOMMENDATIONS:    Avoid aggravating factors (things that can make eczema worse). Try to avoid using soaps, detergents or lotions with perfumes or other fragrances. Other possible aggravating factors include heat, sweating, dry environments, synthetic fibers and tobacco smoke.    Bathing: Take a bath or shower once daily to keep the skin hydrated (moist). Baths should not be longer than 10 to 15 minutes; the water should not be too warm and use fragrance free soap (such as Dove).    Moisturizing ointments/creams (emollients): Apply emollients to entire body as often as possible, but at least once daily. If you are also using topical steroids, then emollients should be used after applying topical steroids. The best emollients are thick creams (such as Eucerin, Cetaphil, Vanicream, CeraVe) or ointments (such as petroleum jelly, Aquaphor, and Vaseline).    Topical steroids:  Topical steroids can be very effective for the treatment of eczema. It is important to use topical steroids as directed by your healthcare provider to reduce the likelihood of any side effects.  For affected areas on the face, neck or groin: Apply hydrocortisone 1% cream twice daily until the skin feels ???smooth???. Then use once or twice daily as needed for flares.  For affected areas on the trunk or extremities: Apply clobetasol  0.05% cream twice daily until the skin feels ???smooth???. Then use once or twice daily as needed for flares.    Please let your healthcare provider know if there is no improvement after 14 days of treatment.      Itching: For itching despite the above treatments, you may give hydroxyzine  (Atarax ) 10-25 mg at bedtime.    Hands: Hands can be very difficult to treat as oftentimes jobs and daily activities require frequent hand washing. Prior to going to bed, apply a thick/greasy cream such as Vaseline or Crisco food shortening to your hands and cover them with cotton gloves overnight.      For more information, please visit the following websites:    UpToDate  retrocab.uy    [facility]  https://www.avila.info/    National Eczema Association  www.nationaleczema.org

## 2024-09-29 ENCOUNTER — Ambulatory Visit
Admit: 2024-09-29 | Discharge: 2024-09-30 | Payer: Medicaid (Managed Care) | Attending: Student in an Organized Health Care Education/Training Program | Primary: Student in an Organized Health Care Education/Training Program

## 2024-09-29 ENCOUNTER — Ambulatory Visit: Admit: 2024-09-29 | Discharge: 2024-09-30 | Payer: Medicaid (Managed Care)

## 2024-09-29 DIAGNOSIS — M47816 Spondylosis without myelopathy or radiculopathy, lumbar region: Principal | ICD-10-CM

## 2024-09-29 DIAGNOSIS — J329 Chronic sinusitis, unspecified: Principal | ICD-10-CM

## 2024-09-29 DIAGNOSIS — J301 Allergic rhinitis due to pollen: Principal | ICD-10-CM

## 2024-09-29 DIAGNOSIS — E0590 Thyrotoxicosis, unspecified without thyrotoxic crisis or storm: Principal | ICD-10-CM

## 2024-09-29 DIAGNOSIS — M5416 Radiculopathy, lumbar region: Principal | ICD-10-CM

## 2024-09-29 DIAGNOSIS — M7918 Myalgia, other site: Principal | ICD-10-CM

## 2024-09-29 DIAGNOSIS — L2084 Intrinsic (allergic) eczema: Principal | ICD-10-CM

## 2024-09-29 DIAGNOSIS — L501 Idiopathic urticaria: Principal | ICD-10-CM

## 2024-09-29 DIAGNOSIS — E0500 Thyrotoxicosis with diffuse goiter without thyrotoxic crisis or storm: Principal | ICD-10-CM

## 2024-09-29 LAB — TSH: THYROID STIMULATING HORMONE: <0.01 u[IU]/mL — ABNORMAL LOW (ref 0.550–4.780)

## 2024-09-29 LAB — T4, FREE: FREE T4: 2.3 ng/dL — ABNORMAL HIGH (ref 0.89–1.76)

## 2024-09-29 LAB — T3: T3 TOTAL: 358.5 ng/dL — ABNORMAL HIGH (ref 60.0–180.0)

## 2024-09-29 MED ORDER — METHIMAZOLE 10 MG TABLET
ORAL_TABLET | Freq: Every day | ORAL | 3 refills | 90.00000 days | Status: CP
Start: 2024-09-29 — End: 2025-09-29

## 2024-09-29 MED ORDER — CLOTRIMAZOLE-BETAMETHASONE 1 %-0.05 % TOPICAL CREAM
Freq: Two times a day (BID) | TOPICAL | 3 refills | 0.00000 days | Status: CP
Start: 2024-09-29 — End: ?

## 2024-09-29 MED ORDER — PREGABALIN 25 MG CAPSULE
ORAL_CAPSULE | ORAL | 1 refills | 0.00000 days | Status: CP
Start: 2024-09-29 — End: ?

## 2024-09-29 MED ORDER — BUDESONIDE 0.25 MG/2 ML SUSPENSION FOR NEBULIZATION
RESPIRATORY_TRACT | 3 refills | 84.00000 days | Status: CP
Start: 2024-09-29 — End: 2025-09-29

## 2024-09-29 NOTE — Patient Instructions (Addendum)
 Follow up in * weeDear Ms. Crumby,    Thank you for coming to see [facility].    -I have ordered physical therapy   -I have ordered the following medications   Medications ordered during this encounter   Medications    pregabalin  (LYRICA ) 25 MG capsule     Sig: Take lyrica  25mg  BID for 1 week, if tolerated increase to 50mg  nightly and 25mg  in morning for 1 week. If tolerated increase to 50mg  BID for 1 week. If tolerated increase to 75mg  nightly and 50mg  in morning. If tolerated increase to 75mg  BID.     Dispense:  180 capsule     Refill:  1     -Please Return in about 8 weeks (around 11/24/2024).    You may contact me through MyChart or call our office with any questions.     If you develop any red flag signs such as new or progressive motor weakness, sensory deficits, saddle anesthesia (groin numbness), bowel/bladder dysfunction, gait/coordination disturbance, weight loss, or night pain, you should call our office and/or seek prompt evaluation at the nearest ER.    [place] Tobie, DO  Interventional Pain Medicine

## 2024-10-11 DIAGNOSIS — Z9109 Other allergy status, other than to drugs and biological substances: Principal | ICD-10-CM

## 2024-10-11 MED ORDER — CETIRIZINE 10 MG TABLET
ORAL_TABLET | Freq: Two times a day (BID) | ORAL | 5 refills | 30.00000 days | Status: CP
Start: 2024-10-11 — End: ?
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
## 2024-10-17 NOTE — Patient Instructions (Signed)
 Thank you for receiving care at [facility] Minimally Invasive Gynecologic Surgery [facility]) ! If you have any questions or concerns you have two ways to contact our team:     1) Nurses line: [phone]  2) MyChart messages    Mychart messages may take up to 7 business days for a response. All mychart messages are received by our nursing team. Our nursing team will contact your provider if they are unable to fully answer your question or concern.   If you have had surgery with our team in the past 3 months, please call the office; do not mychart message. Your call will be triaged based on urgency and returned within 24 hours for urgent issues. Your call will be returned in up to 72 hours for non-urgent issues.  For scheduling appointments, please call the scheduling line [phone]). Our first available appointment is typically 3-5 months out. Please plan accordingly. You may call weekly for cancellations as slots often become available.    3)Work/school leave documentation requests  You may fax documents to [phone] or upload documents to [facility].  If you have questions about your care, please direct them to the nurse contacts above    We will provide documentation for FMLA or short-term disability related directly to your post operative recovery.  Please allow 2 weeks (at least 10 business days) for completion of your forms.    We cannot provide documentation for the following:  Intermittent FMLA  Short-term disability outside of the post operative period  No long-term disability  No handicap placards  Time off prior to your surgical date    We can support you in making these requests to your PCP through our clinical documentation of your clinic visit, which you can find in your MyChart portal.       Other helpful numbers:  Surgical Scheduler: [phone]  Financial Counselor:[phone]  Fax: [phone]    Phone number for imaging scheduling:   [facility]: [phone]  [facility]: [phone]
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
## 2024-11-06 DIAGNOSIS — Z6835 Body mass index (BMI) 35.0-35.9, adult: Principal | ICD-10-CM

## 2024-11-06 MED ORDER — SEMAGLUTIDE (WEIGHT LOSS) 0.5 MG/0.5 ML SUBCUTANEOUS PEN INJECTOR
SUBCUTANEOUS | 0 refills | 0.00000 days | Status: CP
Start: 2024-11-06 — End: ?

## 2024-11-07 NOTE — Telephone Encounter (Signed)
 The patient has an appointment with Lauraine Dimes 11/14/24. In the appointment notes it says the appointment could be a video. A message was left for the patient to call us  back to let us  know if the would like their appointment to be in person or virtual.
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
## 2024-11-14 ENCOUNTER — Encounter
Admit: 2024-11-14 | Discharge: 2024-11-14 | Payer: Medicaid (Managed Care) | Attending: Family Medicine | Primary: Family Medicine

## 2024-11-14 ENCOUNTER — Ambulatory Visit: Admit: 2024-11-14 | Discharge: 2024-11-14 | Payer: Medicaid (Managed Care)

## 2024-11-14 ENCOUNTER — Inpatient Hospital Stay: Admit: 2024-11-14 | Discharge: 2024-11-14 | Payer: Medicaid (Managed Care)

## 2024-11-14 DIAGNOSIS — Z6829 Body mass index (BMI) 29.0-29.9, adult: Principal | ICD-10-CM

## 2024-11-14 DIAGNOSIS — M2392 Unspecified internal derangement of left knee: Secondary | ICD-10-CM

## 2024-11-14 DIAGNOSIS — N898 Other specified noninflammatory disorders of vagina: Secondary | ICD-10-CM

## 2024-11-14 DIAGNOSIS — M1710 Unilateral primary osteoarthritis, unspecified knee: Secondary | ICD-10-CM

## 2024-11-14 DIAGNOSIS — S83282D Other tear of lateral meniscus, current injury, left knee, subsequent encounter: Principal | ICD-10-CM

## 2024-11-14 DIAGNOSIS — Z113 Encounter for screening for infections with a predominantly sexual mode of transmission: Secondary | ICD-10-CM

## 2024-11-14 MED ORDER — ESTRADIOL 0.01% (0.1 MG/GRAM) VAGINAL CREAM
Freq: Every day | VAGINAL | 11 refills | 30.00000 days | Status: CP
Start: 2024-11-14 — End: 2024-11-14

## 2024-11-14 MED ORDER — SEMAGLUTIDE (WEIGHT LOSS) 0.5 MG/0.5 ML SUBCUTANEOUS PEN INJECTOR
SUBCUTANEOUS | 3 refills | 0.00000 days | Status: CP
Start: 2024-11-14 — End: ?

## 2024-11-14 MED ORDER — SEMAGLUTIDE (WEIGHT LOSS) 0.25 MG/0.5 ML SUBCUTANEOUS PEN INJECTOR
SUBCUTANEOUS | 0 refills | 0.00000 days | Status: CP
Start: 2024-11-14 — End: ?

## 2024-11-14 MED ADMIN — triamcinolone acetonide (KENALOG-40) injection 40 mg: 40 mg | INTRA_ARTICULAR | @ 18:00:00 | Stop: 2024-11-14

## 2024-11-14 NOTE — Progress Notes (Signed)
 SPORTS MEDICINE RETURN VISIT    ASSESSMENT      Carrie Bush is a 44 y.o. female  with:  1. Left knee lateral compartment arthritis    PLAN:    --We had a good discussion with the patient regarding her knee pain and management options. At this time, patient would like to continue with non-operative management and that she is not ready to undergo osteotomy at this point in time.  Will continue with injections and oral anti-inflammatories in the form of nonsurgical management until the patient's pain threshold changes.    Procedure  After discussion of risks and benefits, at the patient's request, we provided her with a Left knee corticosteroid injection.  Under standard sterile conditions, the affected knee was injected with a mixture of 4 mL bupivacaine  and 1 mL of 40 mg/mL Kenalog .  She tolerated the injection without complication.         --Imaging findings, further diagnostic and therapeutic options were reviewed with the patient, along with the benefits and drawbacks of each, and the patient expressed understanding    Prescriptions today: PT    Follow-up: PRN    X-rays at next visit:  None      SUBJECTIVE       History of Present Illness: 44 y.o. female who presents for repeat evaluation for her left knee lateral compartment arthritis.  She has most recently undergone an injection in clinic to her knee August , 2025.  She reports that she got good relief from the injection.  She presents today because the injection is worn off and she has had a return of pain symptoms.  She remains uninterested in surgical intervention in the form of corrective osteotomy at this point.      Medical History  Past Medical History[1] Surgical History  Past Surgical History[2] Medications  has a current medication list which includes the following prescription(s): acetaminophen , azelastine , betamethasone  dipropionate, budesonide , cetirizine , clobetasol , clobetasol , clotrimazole -betamethasone , cyclobenzaprine , cyclobenzaprine , diclofenac , empty container, epinephrine , [START ON 11/16/2024] estradiol , fluticasone  propionate, furosemide , ibuprofen , methimazole , naproxen , xolair , pravastatin , pregabalin , propranolol , semaglutide  (non-diabetes), semaglutide  (non-diabetes), semaglutide  (non-diabetes), sodium chloride , tacrolimus , triamcinolone , and triamcinolone .   Tobacco/Alcohol History  Tobacco Use History[3]  Social History     Substance and Sexual Activity   Alcohol Use Not Currently    Alcohol/week: 3.0 standard drinks of alcohol    Types: 3 Standard drinks or equivalent per week    Comment: weekends, 3 vodka drinks    Social History        Occupational History    Not on file       Home Address:  [address]  [place] [address] Family History  Family History[4]     Allergies   Montelukast , Permethrin, Atorvastatin , Rosuvastatin , Simvastatin , and Sulfa (sulfonamide antibiotics)       Review of Systems  .   Carrie Bush  No chest pain, dyspnea, nausea, fevers, chills         OBJECTIVE     Physical Exam:    DETAILED PHYSICAL EXAM  General Appearance well-nourished and no acute distress   Vitals Estimated body mass index is 29.68 kg/m?? as calculated from the following:    Height as of 09/29/24: 167.6 cm (5' 5.98).    Weight as of an earlier encounter on 11/14/24: 83.4 kg (183 lb 12.8 oz).   Mood and Affect alert, cooperative, and pleasant       Cardiovascular well-perfused distally and no swelling     MUSCULOSKELETAL   LEFT:  KNEE: Range of motion: 5/0/135  Effusion / swelling: Mild  Stability: Stable to varus and valgus at both 0 and 30  Tenderness: TTP at both joint lines  Provocative test:Negative Lachman and negative anterior drawer                Imaging/other tests: New x-rays show early lateral compartment arthritis of the left knee which is unchanged in severity.    Orthopaedic PROMIS:  Failed to redirect to the Timeline version of the REVFS SmartLink.         No data to display                PRO Status:  Incomplete.       ADMINISTRATIVE I have personally reviewed and interpreted the images (as available).  I have personally reviewed prior records and incorporated relevant information above (as available).    [email or web address] @    PATIENT PROFILE     Practice: established to provider  Plan: Continued conservative management     PROCEDURES     Procedures     DME     DME ORDER:  Dx:  ,                               [1]   Past Medical History:  Diagnosis Date    Acid reflux     Allergic rhinitis 01/11/2020    Anemia     Anisocoria 12/21/2020    After stroke    Anxiety     Aphasia as late effect of stroke     Arthritis     Asthma (HHS-HCC)     Atopic dermatitis     Autoimmune disease (HHS-HCC)     Blind left eye     Brain concussion     Carotid artery occlusion     Dental caries     Depression     Disease of thyroid gland 2023 and present    Dry eyes     Ganglion cyst     Headache 12/23/2020    After stroke    HLD (hyperlipidemia)     Hypertension     Ischemic stroke    (CMS-HCC) 12/21/2020    Neuromuscular disorder (CMS-HCC)     Obesity     Childhood onset    Patient denies medical problems     Peripheral neuropathy     TIA (transient ischemic attack)     TMJ dysfunction     Tobacco abuse     Tooth sensitivity     Urticaria    [2]   Past Surgical History:  Procedure Laterality Date    ADENOIDECTOMY  1986    EAR TUBE REMOVAL      PR MYOMECTOMY 5/>,TOT>250 GMS,ABD APPRCH Midline 09/18/2022    Procedure: MYOMECTOMY, EXC FIBRD TUMOR(S) UTERUS, 5 OR MORE INTRAM MYOMA &/OR INTRAM MYOMAS TOT WT > 250 GMS, ABD APPR;  Surgeon: Chuck Katie NOVAK, MD;  Location: [facility];  Service: Advanced Laparoscopy    TONSILECTOMY, ADENOIDECTOMY, BILATERAL MYRINGOTOMY AND TUBES      TONSILLECTOMY  1986    TYMPANOSTOMY TUBE PLACEMENT  1994   [3]   Social History  Tobacco Use   Smoking Status Former    Current packs/day: 0.25    Average packs/day: 0.3 packs/day for 6.9 years (1.8 ttl pk-yrs)    Types: Cigarettes    Passive exposure: Past   Smokeless Tobacco Never Tobacco Comments  Occassional smoker   [4]   Family History  Problem Relation Age of Onset    Sarcoidosis Mother     Heart disease Mother         Multiple stents, stent first placed ~50    Arthritis Mother     Hypertension Mother     GER disease Mother     Thyroid disease Mother     Allergic rhinitis Mother     Cancer Mother     Diabetes Mother     Asthma Father     Diabetes Father     ALS Father 82 - 59    Diabetes Sister         Juvenile onset    Early death Sister     Heart failure Sister     Heart disease Sister     Hypertension Sister     Kidney disease Sister     Stroke Sister         3 strokes    Lupus Maternal Grandmother     Alcohol abuse Maternal Grandfather     Cancer Maternal Grandfather     Glaucoma Maternal Aunt     Hypertension Maternal Aunt     Stroke Maternal Aunt     Heart disease Maternal Aunt 78 - 9    Aneurysm Maternal Aunt     Diabetes Sister         Juvenile onset    Early death Sister     Hypertension Sister     Stroke Sister         3 strokes    Heart disease Sister     Kidney disease Sister     Glaucoma Maternal Aunt     Hypertension Maternal Aunt     Stroke Maternal Aunt     Aneurysm Maternal Aunt     Diabetes Sister 68    Breast cancer Neg Hx     Colon cancer Neg Hx     Ovarian cancer Neg Hx     Endometrial cancer Neg Hx     Uterine cancer Neg Hx     Clotting disorder Neg Hx     Anesthesia problems Neg Hx

## 2024-11-15 LAB — HIV ANTIGEN/ANTIBODY COMBO: HIV ANTIGEN/ANTIBODY COMBO: NONREACTIVE

## 2024-11-15 LAB — SYPHILIS SCREEN: SYPHILIS RPR SCREEN: NONREACTIVE

## 2024-11-15 NOTE — Progress Notes (Signed)
 [facility] Refill Coordination Note    Specialty Medication(s) to be Shipped:   CF/Pulmonary/Asthma: Xolair     Other medication(s) to be shipped: No additional medications requested for fill at this time    Specialty Medications not needed at this time: N/A     Carrie Bush, DOB: 06/24/1981  Phone: [phone] (home) [phone] (work)      All above HIPAA information was verified with patient.     Was a nurse, learning disability used for this call? No    Completed refill call assessment today to schedule patient's medication shipment from the [facility]  [phone]).  All relevant notes have been reviewed.     Specialty medication(s) and dose(s) confirmed: Regimen is correct and unchanged.   Changes to medications: Syniyah reports no changes at this time.  Changes to insurance: No  New side effects reported not previously addressed with a pharmacist or physician: None reported  Questions for the pharmacist: No    Confirmed patient received a Conservation Officer, Historic Buildings and a Surveyor, Mining with first shipment. The patient will receive a drug information handout for each medication shipped and additional FDA Medication Guides as required.       DISEASE/MEDICATION-SPECIFIC INFORMATION        N/A    SPECIALTY MEDICATION ADHERENCE     Medication Adherence    Patient reported X missed doses in the last month: 0  Specialty Medication: XOLAIR  300 mg/2 mL syringe (omalizumab )  Patient is on additional specialty medications: No              Were doses missed due to medication being on hold? No     XOLAIR  300 mg/2 mL syringe (omalizumab )  : 0 doses of medicine on hand       Specialty medication is an injection or given on a cycle: Yes, Next injection is scheduled for 11/21/2024.    REFERRAL TO PHARMACIST     Referral to the pharmacist: Not needed      [facility]     Shipping address confirmed in Epic.     Cost and Payment: Patient has a copay of $4.00. They are aware and have authorized the pharmacy to charge the credit card on file.    Delivery Scheduled: Yes, Expected medication delivery date: 11/21/2024.     Medication will be delivered via Next Day Courier to the prescription address in Epic WAM.     Laymon Duty   [facility]  Specialty Technician

## 2024-11-16 DIAGNOSIS — Z8673 Personal history of transient ischemic attack (TIA), and cerebral infarction without residual deficits: Principal | ICD-10-CM

## 2024-11-16 DIAGNOSIS — E0590 Thyrotoxicosis, unspecified without thyrotoxic crisis or storm: Secondary | ICD-10-CM

## 2024-11-16 DIAGNOSIS — R000 Tachycardia, unspecified: Secondary | ICD-10-CM

## 2024-11-16 DIAGNOSIS — I10 Essential (primary) hypertension: Secondary | ICD-10-CM

## 2024-11-16 DIAGNOSIS — I6522 Occlusion and stenosis of left carotid artery: Secondary | ICD-10-CM

## 2024-11-16 DIAGNOSIS — Z5181 Encounter for therapeutic drug level monitoring: Secondary | ICD-10-CM

## 2024-11-16 DIAGNOSIS — Z6829 Body mass index (BMI) 29.0-29.9, adult: Principal | ICD-10-CM

## 2024-11-16 LAB — COMPREHENSIVE METABOLIC PANEL
ALBUMIN: 3.1 g/dL — ABNORMAL LOW (ref 3.4–5.0)
ALKALINE PHOSPHATASE: 185 U/L — ABNORMAL HIGH (ref 46–116)
ALT (SGPT): 17 U/L (ref 10–49)
ANION GAP: 13 mmol/L (ref 5–14)
AST (SGOT): 18 U/L (ref <=34)
BILIRUBIN TOTAL: 0.3 mg/dL (ref 0.3–1.2)
BLOOD UREA NITROGEN: 8 mg/dL — ABNORMAL LOW (ref 9–23)
BUN / CREAT RATIO: 15
CALCIUM: 9.7 mg/dL (ref 8.7–10.4)
CHLORIDE: 105 mmol/L (ref 98–107)
CO2: 25 mmol/L (ref 20.0–31.0)
CREATININE: 0.54 mg/dL — ABNORMAL LOW (ref 0.55–1.02)
EGFR CKD-EPI (2021) FEMALE: >90 mL/min/{1.73_m2} (ref >=60)
GLUCOSE RANDOM: 106 mg/dL (ref 70–179)
POTASSIUM: 4.2 mmol/L (ref 3.5–5.1)
PROTEIN TOTAL: 6.4 g/dL (ref 5.7–8.2)
SODIUM: 143 mmol/L (ref 135–145)

## 2024-11-16 LAB — LIPID PANEL
CHOLESTEROL: 183 mg/dL (ref <200)
HDL CHOLESTEROL: 41 mg/dL — ABNORMAL LOW (ref >50)
LDL CHOLESTEROL CALCULATED: 88 mg/dL (ref <100)
NON-HDL CHOLESTEROL: 142 mg/dL — ABNORMAL HIGH (ref <130)
TRIGLYCERIDES: 355 mg/dL — ABNORMAL HIGH (ref <150)

## 2024-11-16 MED ORDER — ESTRADIOL 0.01% (0.1 MG/GRAM) VAGINAL CREAM
VAGINAL | 11 refills | 28.00000 days | Status: CP
Start: 2024-11-16 — End: 2025-11-16

## 2024-11-16 MED ORDER — BLOOD PRESSURE TEST KIT-MEDIUM CUFF
PACK | Freq: Every day | 0 refills | 0.00000 days | Status: CP
Start: 2024-11-16 — End: ?
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
